# Patient Record
Sex: Female | Born: 2000 | Race: Asian | Hispanic: No | Marital: Single | Smoking: Current every day smoker
Health system: Southern US, Community
[De-identification: ages and names within clinical notes are randomized; demographics above are authoritative.]

## PROBLEM LIST (undated history)

## (undated) DIAGNOSIS — U071 COVID-19: Secondary | ICD-10-CM

## (undated) DIAGNOSIS — F32A Depression, unspecified: Secondary | ICD-10-CM

## (undated) HISTORY — DX: COVID-19: U07.1

## (undated) HISTORY — DX: Depression, unspecified: F32.A

---
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
## 2019-09-21 ENCOUNTER — Encounter (HOSPITAL_COMMUNITY): Payer: Self-pay

## 2019-09-21 ENCOUNTER — Ambulatory Visit (HOSPITAL_COMMUNITY)
Admission: EM | Admit: 2019-09-21 | Discharge: 2019-09-21 | Disposition: A | Discharge status: Home / Self Care | Payer: Medicaid Other | Attending: Physician Assistant | Admitting: Physician Assistant

## 2019-09-21 ENCOUNTER — Ambulatory Visit (INDEPENDENT_AMBULATORY_CARE_PROVIDER_SITE_OTHER): Payer: Medicaid Other

## 2019-09-21 ENCOUNTER — Other Ambulatory Visit: Payer: Self-pay

## 2019-09-21 DIAGNOSIS — S92354A Nondisplaced fracture of fifth metatarsal bone, right foot, initial encounter for closed fracture: Secondary | ICD-10-CM

## 2019-09-21 MED ORDER — IBUPROFEN 800 MG PO TABS: 800.0000 mg | ORAL_TABLET | Freq: Three times a day (TID) | ORAL | 0 refills | Status: DC
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
## 2019-09-21 NOTE — Discharge Instructions (Signed)
Wear the boot in order to protect the areas of pain. Utilize crutches and do not bear weight on your right foot.  Ice the foot for 20-30 minutes at a time for the next day.  Take 800mg  ibuprofen 3 times a day and 2 x 325 mg tylenol every 6 hours for pain.  Do not take the naproxen that you have been prescribed previously, while taking ibuprofen.   Please follow up with the given Orthopedic office. This clinic also has walk in hours in the evening. If you can not schedule an appointment, please go to a walk-in hours. [email or web address] for more information. The number is also provided.
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
## 2021-04-06 ENCOUNTER — Encounter (HOSPITAL_COMMUNITY): Payer: Self-pay

## 2021-04-06 ENCOUNTER — Other Ambulatory Visit: Payer: Self-pay

## 2021-04-06 ENCOUNTER — Ambulatory Visit (HOSPITAL_COMMUNITY)
Admission: EM | Admit: 2021-04-06 | Discharge: 2021-04-06 | Disposition: A | Discharge status: Home / Self Care | Payer: No Typology Code available for payment source | Attending: Family | Admitting: Family

## 2021-04-06 ENCOUNTER — Ambulatory Visit (INDEPENDENT_AMBULATORY_CARE_PROVIDER_SITE_OTHER): Payer: No Typology Code available for payment source

## 2021-04-06 DIAGNOSIS — R079 Chest pain, unspecified: Secondary | ICD-10-CM

## 2021-04-06 DIAGNOSIS — R0789 Other chest pain: Secondary | ICD-10-CM

## 2021-04-06 MED ORDER — DICLOFENAC SODIUM 75 MG PO TBEC: 75.0000 mg | DELAYED_RELEASE_TABLET | Freq: Two times a day (BID) | ORAL | 0 refills | Status: DC | PRN

## 2021-04-06 NOTE — ED Triage Notes (Signed)
Pt presents with sharp intermittent chest pain since yesterday.

## 2021-04-06 NOTE — ED Provider Notes (Signed)
[facility]    CSN: [id number] Arrival date & time: 04/06/21  1709      History   Chief Complaint Chief Complaint  Patient presents with   Chest Pain    HPI Janet Silva is a 20 y.o. female.   20 year old female presents with central chest pain that has been constant since yesterday. Will then get intermittent sharper pains and now has pain along left lower rib cage. Having slight discomfort with deep breathing. Denies any fever, dizziness, change in vision, cough, congestion, nausea, vomiting or diarrhea. No radiation of pain down arms. No numbness. Has not taken any medication yet for pain. Has history of chest wall pain in 2018 and was seen at the ER- thought to be musculoskeletal. No history of smoking or tobacco use, no alcohol or illicit drug use. No chronic health issues. Takes no daily medication.   The history is provided by the patient.   History reviewed. No pertinent past medical history.  There are no problems to display for this patient.   History reviewed. No pertinent surgical history.  OB History   No obstetric history on file.      Home Medications    Prior to Admission medications   Medication Sig Start Date End Date Taking? Authorizing Provider  diclofenac (VOLTAREN) 75 MG EC tablet Take 1 tablet (75 mg total) by mouth every 12 (twelve) hours as needed for moderate pain. 04/06/21  Yes Krishawn Vanderweele, Ali Lowe, NP    Family History History reviewed. No pertinent family history.  Social History Social History   Tobacco Use   Smoking status: Never   Smokeless tobacco: Never  Substance Use Topics   Alcohol use: No   Drug use: No     Allergies   Patient has no known allergies.   Review of Systems Review of Systems  Constitutional:  Negative for activity change, appetite change, chills, diaphoresis, fatigue and fever.  HENT:  Negative for congestion, sore throat and trouble swallowing.   Eyes:  Negative for photophobia and visual  disturbance.  Respiratory:  Negative for cough, chest tightness, shortness of breath and wheezing.   Cardiovascular:  Positive for chest pain. Negative for palpitations.  Gastrointestinal:  Negative for diarrhea, nausea and vomiting.  Musculoskeletal:  Negative for back pain, neck pain and neck stiffness.  Skin:  Negative for color change and rash.  Allergic/Immunologic: Negative for food allergies and immunocompromised state.  Neurological:  Negative for dizziness, tremors, seizures, syncope, facial asymmetry, speech difficulty, weakness, light-headedness, numbness and headaches.  Hematological:  Negative for adenopathy. Does not bruise/bleed easily.    Physical Exam Triage Vital Signs ED Triage Vitals  Enc Vitals Group     BP 04/06/21 1858 113/70     Pulse Rate 04/06/21 1858 88     Resp 04/06/21 1858 18     Temp 04/06/21 1858 98.3 F (36.8 C)     Temp Source 04/06/21 1858 Oral     SpO2 04/06/21 1858 100 %     Weight --      Height --      Head Circumference --      Peak Flow --      Pain Score 04/06/21 1804 5     Pain Loc --      Pain Edu? --      Excl. in [place]? --    No data found.  Updated Vital Signs BP 113/70 (BP Location: Left Arm)   Pulse 88   Temp  98.3 F (36.8 C) (Oral)   Resp 18   LMP 03/21/2021   SpO2 100%   Visual Acuity Right Eye Distance:   Left Eye Distance:   Bilateral Distance:    Right Eye Near:   Left Eye Near:    Bilateral Near:     Physical Exam Vitals and nursing note reviewed.  Constitutional:      General: She is awake. She is not in acute distress.    Appearance: She is well-developed and well-groomed.     Comments: She is sitting on the exam table in no acute distress but appears uncomfortable due to pain.   HENT:     Head: Normocephalic and atraumatic.     Right Ear: Hearing normal.     Left Ear: Hearing normal.  Eyes:     Extraocular Movements: Extraocular movements intact.     Conjunctiva/sclera: Conjunctivae normal.      Pupils: Pupils are equal, round, and reactive to light.  Cardiovascular:     Rate and Rhythm: Normal rate and regular rhythm.     Pulses: Normal pulses.     Heart sounds: Normal heart sounds. No murmur heard. Pulmonary:     Effort: Pulmonary effort is normal. No prolonged expiration or respiratory distress.     Breath sounds: Normal breath sounds and air entry. No decreased air movement. No decreased breath sounds, wheezing, rhonchi or rales.  Chest:     Chest wall: Tenderness present. No mass, deformity or swelling.  Breasts:    Right: No supraclavicular adenopathy.     Left: No supraclavicular adenopathy.       Comments: Tender to palpation over upper sternal area of chest along with along left rib cage, just below breast to axilla area. No distinct swelling or redness. No rash.  Musculoskeletal:        General: Normal range of motion.     Cervical back: Normal range of motion and neck supple. No rigidity or tenderness.  Lymphadenopathy:     Upper Body:     Right upper body: No supraclavicular adenopathy.     Left upper body: No supraclavicular adenopathy.  Skin:    General: Skin is warm and dry.     Capillary Refill: Capillary refill takes less than 2 seconds.     Findings: No rash.  Neurological:     General: No focal deficit present.     Mental Status: She is alert and oriented to person, place, and time.     Sensory: Sensation is intact.     Motor: Motor function is intact.     Gait: Gait is intact.  Psychiatric:        Mood and Affect: Mood normal.        Behavior: Behavior normal. Behavior is cooperative.        Thought Content: Thought content normal.        Judgment: Judgment normal.     UC Treatments / Results  Labs (all labs ordered are listed, but only abnormal results are displayed) Labs Reviewed - No data to display  EKG   Radiology DG Chest 2 View  Result Date: 04/06/2021 CLINICAL DATA:  Left-sided chest pain EXAM: CHEST - 2 VIEW COMPARISON:  None.  FINDINGS: The heart size and mediastinal contours are within normal limits. Both lungs are clear. The visualized skeletal structures are unremarkable. IMPRESSION: No active cardiopulmonary disease. Electronically Signed   By: Jasmine Pang M.D.   On: 04/06/2021 19:30    Procedures Procedures (including  critical care time)  Medications Ordered in UC Medications - No data to display  Initial Impression / Assessment and Plan / UC Course  I have reviewed the triage vital signs and the nursing notes.  Pertinent labs & imaging results that were available during my care of the patient were reviewed by me and considered in my medical decision making (see chart for details).     Reviewed negative chest x-ray results with patient. Since chest wall tender upon palpation, most likely musculoskeletal cause of chest wall pain. Doubt cardiac etiology so did not perform ECG. Doubt pulmonary emboli since risk factors are low (no hormonal therapy and does not smoke). Discussed uncertain cause of pain but continue to monitor. May trial Voltaren 75mg  twice a day as directed for pain. Vital signs and patient stable and in no acute distress. If pain does not improve within 12 hours or if pain gets worse, go to the ER ASAP for further evaluation. Otherwise, follow-up if pain does not resolve within 4 to 5 days.  Final Clinical Impressions(s) / UC Diagnoses   Final diagnoses:  Chest wall pain  Nonspecific chest pain     Discharge Instructions      Recommend start Voltaren 75mg  twice a day as needed for pain. Continue to monitor pain- if it gets worse, go to the ER ASAP. Otherwise, follow-up if pain does not resolve in 4 to 5 days.      ED Prescriptions     Medication Sig Dispense Auth. Provider   diclofenac (VOLTAREN) 75 MG EC tablet Take 1 tablet (75 mg total) by mouth every 12 (twelve) hours as needed for moderate pain. 20 tablet Zaley Talley, , NP      PDMP not reviewed this encounter.   , NP 04/07/21 1428

## 2021-04-06 NOTE — Discharge Instructions (Addendum)
Recommend start Voltaren 75mg  twice a day as needed for pain. Continue to monitor pain- if it gets worse, go to the ER ASAP. Otherwise, follow-up if pain does not resolve in 4 to 5 days.

## 2021-04-10 ENCOUNTER — Other Ambulatory Visit: Payer: Self-pay

## 2021-04-10 ENCOUNTER — Encounter (HOSPITAL_COMMUNITY): Payer: Self-pay | Admitting: *Deleted

## 2021-04-10 ENCOUNTER — Ambulatory Visit (HOSPITAL_COMMUNITY)
Admission: EM | Admit: 2021-04-10 | Discharge: 2021-04-10 | Disposition: A | Discharge status: Home / Self Care | Payer: No Typology Code available for payment source

## 2021-04-10 DIAGNOSIS — M25512 Pain in left shoulder: Secondary | ICD-10-CM | POA: Diagnosis not present

## 2021-04-10 NOTE — ED Triage Notes (Signed)
Pt reports Lt shoulder and arm pain started last night after lifting heavy items at work

## 2021-04-10 NOTE — ED Provider Notes (Signed)
[facility]    CSN: [id number] Arrival date & time: 04/10/21  1504      History   Chief Complaint Chief Complaint  Patient presents with   Shoulder Pain   Arm Pain    HPI Janet Silva is a 20 y.o. female.   Patient here for evaluation of left shoulder pain that started while lifting heavy objects last night.  Reports pain is sharp and worse with movement.  Denies any numbness or tingling to left arm.  Reports having similar pain in her chest several days ago for which she was evaluated and prescribed diclofenac.  Reports taking diclofenac this am which did help with pain temporarily.   Denies any trauma, injury, or other precipitating event.  Denies any specific alleviating or aggravating factors.  Denies any fevers, chest pain, shortness of breath, N/V/D, numbness, tingling, weakness, abdominal pain, or headaches.     The history is provided by the patient.  Shoulder Pain Arm Pain   History reviewed. No pertinent past medical history.  There are no problems to display for this patient.   History reviewed. No pertinent surgical history.  OB History   No obstetric history on file.      Home Medications    Prior to Admission medications   Medication Sig Start Date End Date Taking? Authorizing Provider  diclofenac (VOLTAREN) 75 MG EC tablet Take 1 tablet (75 mg total) by mouth every 12 (twelve) hours as needed for moderate pain. 04/06/21   Sudie Grumbling, NP    Family History History reviewed. No pertinent family history.  Social History Social History   Tobacco Use   Smoking status: Never   Smokeless tobacco: Never  Substance Use Topics   Alcohol use: No   Drug use: No     Allergies   Patient has no known allergies.   Review of Systems Review of Systems  Musculoskeletal:  Positive for arthralgias and joint swelling.  All other systems reviewed and are negative.   Physical Exam Triage Vital Signs ED Triage Vitals [04/10/21 1527]  Enc  Vitals Group     BP 116/67     Pulse Rate 61     Resp 18     Temp 98.7 F (37.1 C)     Temp src      SpO2 100 %     Weight      Height      Head Circumference      Peak Flow      Pain Score      Pain Loc      Pain Edu?      Excl. in [place]?    No data found.  Updated Vital Signs BP 116/67   Pulse 61   Temp 98.7 F (37.1 C)   Resp 18   LMP 03/21/2021   SpO2 100%   Visual Acuity Right Eye Distance:   Left Eye Distance:   Bilateral Distance:    Right Eye Near:   Left Eye Near:    Bilateral Near:     Physical Exam Vitals and nursing note reviewed.  Constitutional:      General: She is not in acute distress.    Appearance: Normal appearance. She is not ill-appearing, toxic-appearing or diaphoretic.  HENT:     Head: Normocephalic and atraumatic.  Eyes:     Conjunctiva/sclera: Conjunctivae normal.  Cardiovascular:     Rate and Rhythm: Normal rate.     Pulses: Normal pulses.  Pulmonary:  Effort: Pulmonary effort is normal.  Abdominal:     General: Abdomen is flat.  Musculoskeletal:        General: Normal range of motion.     Right shoulder: Normal.     Left shoulder: Tenderness present. No deformity, effusion, laceration or bony tenderness. Normal range of motion. Normal strength. Normal pulse.       Arms:     Cervical back: Normal range of motion.  Skin:    General: Skin is warm and dry.  Neurological:     General: No focal deficit present.     Mental Status: She is alert and oriented to person, place, and time.  Psychiatric:        Mood and Affect: Mood normal.     UC Treatments / Results  Labs (all labs ordered are listed, but only abnormal results are displayed) Labs Reviewed - No data to display  EKG   Radiology No results found.  Procedures Procedures (including critical care time)  Medications Ordered in UC Medications - No data to display  Initial Impression / Assessment and Plan / UC Course  I have reviewed the triage vital signs  and the nursing notes.  Pertinent labs & imaging results that were available during my care of the patient were reviewed by me and considered in my medical decision making (see chart for details).    Assessment negative for red flags or concerns.  Acute pain of the left shoulder.  May continue to take diclofenac as previously prescribed.  May also try Tylenol as needed for pain.  Encourage rest and fluids.  Recommend heat, ice, or alternate between heat and ice.  Follow-up with primary care if symptoms do not improve in the next few days. Final Clinical Impressions(s) / UC Diagnoses   Final diagnoses:  Acute pain of left shoulder     Discharge Instructions      Take the Diclofenac as previously prescribed.    You can also try Tylenol as needed for pain and fevers.   Rest and drink plenty of fluids.   You can also try heat, ice, or alternate between heat and ice for comfort.  You can use icy hot patches or lidocaine patches.   Return or go to the Emergency Department if symptoms worsen or do not improve in the next few days.      ED Prescriptions   None    PDMP not reviewed this encounter.   Ivette Loyal, NP 04/10/21 [phone]

## 2021-04-10 NOTE — Discharge Instructions (Signed)
Take the Diclofenac as previously prescribed.    You can also try Tylenol as needed for pain and fevers.   Rest and drink plenty of fluids.   You can also try heat, ice, or alternate between heat and ice for comfort.  You can use icy hot patches or lidocaine patches.   Return or go to the Emergency Department if symptoms worsen or do not improve in the next few days.

## 2021-05-12 ENCOUNTER — Emergency Department (HOSPITAL_COMMUNITY)
Admission: EM | Admit: 2021-05-12 | Discharge: 2021-05-14 | Disposition: A | Discharge status: Home / Self Care | Payer: No Typology Code available for payment source | Attending: Emergency Medicine | Admitting: Emergency Medicine

## 2021-05-12 ENCOUNTER — Other Ambulatory Visit: Payer: Self-pay

## 2021-05-12 ENCOUNTER — Emergency Department (HOSPITAL_COMMUNITY): Payer: No Typology Code available for payment source

## 2021-05-12 ENCOUNTER — Encounter (HOSPITAL_COMMUNITY): Payer: Self-pay

## 2021-05-12 DIAGNOSIS — Y900 Blood alcohol level of less than 20 mg/100 ml: Secondary | ICD-10-CM | POA: Diagnosis not present

## 2021-05-12 DIAGNOSIS — F329 Major depressive disorder, single episode, unspecified: Secondary | ICD-10-CM | POA: Insufficient documentation

## 2021-05-12 DIAGNOSIS — F1410 Cocaine abuse, uncomplicated: Secondary | ICD-10-CM | POA: Diagnosis present

## 2021-05-12 DIAGNOSIS — T39392A Poisoning by other nonsteroidal anti-inflammatory drugs [NSAID], intentional self-harm, initial encounter: Secondary | ICD-10-CM | POA: Diagnosis not present

## 2021-05-12 DIAGNOSIS — U071 COVID-19: Secondary | ICD-10-CM | POA: Diagnosis present

## 2021-05-12 DIAGNOSIS — M79641 Pain in right hand: Secondary | ICD-10-CM | POA: Insufficient documentation

## 2021-05-12 DIAGNOSIS — T50902A Poisoning by unspecified drugs, medicaments and biological substances, intentional self-harm, initial encounter: Secondary | ICD-10-CM

## 2021-05-12 DIAGNOSIS — R45851 Suicidal ideations: Secondary | ICD-10-CM

## 2021-05-12 DIAGNOSIS — F1421 Cocaine dependence, in remission: Secondary | ICD-10-CM | POA: Diagnosis present

## 2021-05-12 LAB — CBC
HCT: 37.3 % (ref 36.0–46.0)
Hemoglobin: 11.4 g/dL — ABNORMAL LOW (ref 12.0–15.0)
MCH: 22.6 pg — ABNORMAL LOW (ref 26.0–34.0)
MCHC: 30.6 g/dL (ref 30.0–36.0)
MCV: 73.9 fL — ABNORMAL LOW (ref 80.0–100.0)
Platelets: 273 10*3/uL (ref 150–400)
RBC: 5.05 MIL/uL (ref 3.87–5.11)
RDW: 19.1 % — ABNORMAL HIGH (ref 11.5–15.5)
WBC: 8 10*3/uL (ref 4.0–10.5)
nRBC: 0 % (ref 0.0–0.2)

## 2021-05-12 LAB — COMPREHENSIVE METABOLIC PANEL
ALT: 12 U/L (ref 0–44)
AST: 19 U/L (ref 15–41)
Albumin: 4.2 g/dL (ref 3.5–5.0)
Alkaline Phosphatase: 69 U/L (ref 38–126)
Anion gap: 9 (ref 5–15)
BUN: 15 mg/dL (ref 6–20)
CO2: 25 mmol/L (ref 22–32)
Calcium: 9.4 mg/dL (ref 8.9–10.3)
Chloride: 104 mmol/L (ref 98–111)
Creatinine, Ser: 0.82 mg/dL (ref 0.44–1.00)
GFR, Estimated: >60 mL/min (ref >60)
Glucose, Bld: 72 mg/dL (ref 70–99)
Potassium: 3.5 mmol/L (ref 3.5–5.1)
Sodium: 138 mmol/L (ref 135–145)
Total Bilirubin: 0.8 mg/dL (ref 0.3–1.2)
Total Protein: 8.2 g/dL — ABNORMAL HIGH (ref 6.5–8.1)

## 2021-05-12 LAB — RESP PANEL BY RT-PCR (FLU A&B, COVID) ARPGX2
Influenza A by PCR: NEGATIVE
Influenza B by PCR: NEGATIVE
SARS Coronavirus 2 by RT PCR: POSITIVE — AB

## 2021-05-12 LAB — RAPID URINE DRUG SCREEN, HOSP PERFORMED
Amphetamines: NOT DETECTED
Barbiturates: NOT DETECTED
Benzodiazepines: NOT DETECTED
Cocaine: POSITIVE — AB
Opiates: NOT DETECTED
Tetrahydrocannabinol: NOT DETECTED

## 2021-05-12 LAB — I-STAT BETA HCG BLOOD, ED (MC, WL, AP ONLY): I-stat hCG, quantitative: <5 m[IU]/mL (ref <5)

## 2021-05-12 LAB — ETHANOL: Alcohol, Ethyl (B): <10 mg/dL (ref <10)

## 2021-05-12 LAB — MAGNESIUM: Magnesium: 2.2 mg/dL (ref 1.7–2.4)

## 2021-05-12 LAB — SALICYLATE LEVEL: Salicylate Lvl: <7 mg/dL — ABNORMAL LOW (ref 7.0–30.0)

## 2021-05-12 LAB — ACETAMINOPHEN LEVEL: Acetaminophen (Tylenol), Serum: <10 ug/mL — ABNORMAL LOW (ref 10–30)

## 2021-05-12 MED ORDER — SODIUM CHLORIDE 0.9 % IV BOLUS
1000.0000 mL | Freq: Once | INTRAVENOUS | Status: AC
  Administered 2021-05-12: 1000 mL via INTRAVENOUS

## 2021-05-12 NOTE — ED Notes (Addendum)
Called Poison Control. Recommendations: Can cause nausea, vomiting, drowsiness, renal insufficiency, metabolic acidosis, cardiac monitor, check EKG, 4 hour tylenol level, check CMP, Magnesium, alcohol, antiemetics as needed. Repeat BMP after 4 hours to make sure kidney function is good and not acidotic.

## 2021-05-12 NOTE — ED Notes (Signed)
Patient reports doing cocaine this morning and had 2 beers at 1pm.

## 2021-05-12 NOTE — ED Provider Notes (Signed)
[facility]-EMERGENCY DEPT Provider Note   CSN: [id number] Arrival date & time: 05/12/21  2158     History Chief Complaint  Patient presents with   Suicide Attempt    Janet Silva is a 20 y.o. female without significant past medical hx who presents to the ED S/p suicide attempt @ 20:30-2100 tonight. Patient states she took 6-7 tablets of diclofenac, unknown dose, no co-ingestion, and she punched a wall with her right hand. Having some mild nausea and some right hand pain. No alleviating/aggravating factors. Has been feeling depressed leading to suicide attempt. Denies HI or hallucinations. Had cocaine this AM and a couple of beers around noon today.   HPI     History reviewed. No pertinent past medical history.  There are no problems to display for this patient.   History reviewed. No pertinent surgical history.   OB History   No obstetric history on file.     History reviewed. No pertinent family history.  Social History   Tobacco Use   Smoking status: Never   Smokeless tobacco: Never  Substance Use Topics   Alcohol use: Yes    Comment: "every day i drink all of them"   Drug use: Yes    Frequency: 2.0 times per week    Types: Cocaine    Home Medications Prior to Admission medications   Medication Sig Start Date End Date Taking? Authorizing Provider  diclofenac (VOLTAREN) 75 MG EC tablet Take 1 tablet (75 mg total) by mouth every 12 (twelve) hours as needed for moderate pain. 04/06/21  Yes Amyot, Ali Lowe, NP    Allergies    Patient has no known allergies.  Review of Systems   Review of Systems  Constitutional:  Negative for chills and fever.  Respiratory:  Negative for cough and shortness of breath.   Cardiovascular:  Negative for chest pain.  Gastrointestinal:  Positive for nausea. Negative for abdominal pain, blood in stool, constipation, diarrhea and vomiting.  Genitourinary:  Negative for dysuria.  Musculoskeletal:  Positive for  arthralgias.  Neurological:  Negative for syncope.  All other systems reviewed and are negative.  Physical Exam Updated Vital Signs BP 111/70   Pulse (!) 54   Temp 98.1 F (36.7 C) (Oral)   Resp 18   Ht 5\' 7"  (1.702 m)   Wt 46.3 kg   SpO2 99%   BMI 15.98 kg/m   Physical Exam Vitals and nursing note reviewed.  Constitutional:      General: She is not in acute distress.    Appearance: Normal appearance. She is well-developed. She is not ill-appearing or toxic-appearing.  HENT:     Head: Normocephalic and atraumatic.  Eyes:     General:        Right eye: No discharge.        Left eye: No discharge.     Conjunctiva/sclera: Conjunctivae normal.  Neck:     Comments: No midline tenderness.  Cardiovascular:     Rate and Rhythm: Normal rate and regular rhythm.     Pulses:          Radial pulses are 2+ on the right side and 2+ on the left side.  Pulmonary:     Effort: Pulmonary effort is normal. No respiratory distress.     Breath sounds: Normal breath sounds. No wheezing, rhonchi or rales.  Abdominal:     General: There is no distension.     Palpations: Abdomen is soft.     Tenderness:  There is no abdominal tenderness.  Musculoskeletal:     Cervical back: Normal range of motion and neck supple.     Comments: Upper extremities: Mild swelling to the right 5th MCP. No  erythema or open wounds. Patient has intact AROM throughout. . Tender to palpation over the right 4th/5th MCPs and proximal phalanges otherwise nontender. No anatomical snuffbox tenderness.   Skin:    General: Skin is warm and dry.     Capillary Refill: Capillary refill takes less than 2 seconds.     Findings: No rash.  Neurological:     Mental Status: She is alert.     Comments: Alert. Clear speech. Sensation grossly intact to bilateral upper extremities. 5/5 symmetric grip strength. Ambulatory.   Psychiatric:        Behavior: Behavior is cooperative.        Thought Content: Thought content includes suicidal  ideation. Thought content does not include homicidal ideation.    ED Results / Procedures / Treatments   Labs (all labs ordered are listed, but only abnormal results are displayed) Labs Reviewed  RESP PANEL BY RT-PCR (FLU A&B, COVID) ARPGX2 - Abnormal; Notable for the following components:      Result Value   SARS Coronavirus 2 by RT PCR POSITIVE (*)    All other components within normal limits  CBC - Abnormal; Notable for the following components:   Hemoglobin 11.4 (*)    MCV 73.9 (*)    MCH 22.6 (*)    RDW 19.1 (*)    All other components within normal limits  COMPREHENSIVE METABOLIC PANEL - Abnormal; Notable for the following components:   Total Protein 8.2 (*)    All other components within normal limits  SALICYLATE LEVEL - Abnormal; Notable for the following components:   Salicylate Lvl <7.0 (*)    All other components within normal limits  ACETAMINOPHEN LEVEL - Abnormal; Notable for the following components:   Acetaminophen (Tylenol), Serum <10 (*)    All other components within normal limits  RAPID URINE DRUG SCREEN, HOSP PERFORMED - Abnormal; Notable for the following components:   Cocaine POSITIVE (*)    All other components within normal limits  BASIC METABOLIC PANEL - Abnormal; Notable for the following components:   Potassium 3.4 (*)    Glucose, Bld 60 (*)    Calcium 8.6 (*)    All other components within normal limits  ACETAMINOPHEN LEVEL - Abnormal; Notable for the following components:   Acetaminophen (Tylenol), Serum <10 (*)    All other components within normal limits  ETHANOL  MAGNESIUM  I-STAT BETA HCG BLOOD, ED ([facility], [facility], [facility] ONLY)  CBG MONITORING, ED    EKG EKG Interpretation  Date/Time:  Thursday May 12 2021 22:48:43 EDT Ventricular Rate:  86 PR Interval:  156 QRS Duration: 90 QT Interval:  379 QTC Calculation: 454 R Axis:   83 Text Interpretation: Sinus rhythm Probable left ventricular hypertrophy Confirmed by Kristine Royal [phone]) on  05/12/2021 11:01:27 PM  Radiology DG Hand Complete Right  Result Date: 05/12/2021 CLINICAL DATA:  Hand pain common no known injury, initial encounter EXAM: RIGHT HAND - COMPLETE 3+ VIEW COMPARISON:  None. FINDINGS: There is no evidence of fracture or dislocation. There is no evidence of arthropathy or other focal bone abnormality. Soft tissues are unremarkable. IMPRESSION: No acute abnormality noted. Electronically Signed   By: Alcide Clever M.D.   On: 05/12/2021 22:54    Procedures Procedures   Medications Ordered in ED Medications  sodium chloride 0.9 % bolus 1,000 mL (0 mLs Intravenous Stopped 05/13/21 0053)    ED Course  I have reviewed the triage vital signs and the nursing notes.  Pertinent labs & imaging results that were available during my care of the patient were reviewed by me and considered in my medical decision making (see chart for details).  Janet Silva was evaluated in Emergency Department on 05/13/2021 for the symptoms described in the history of present illness. He/she was evaluated in the context of the global COVID-19 pandemic, which necessitated consideration that the patient might be at risk for infection with the SARS-CoV-2 virus that causes COVID-19. Institutional protocols and algorithms that pertain to the evaluation of patients at risk for COVID-19 are in a state of rapid change based on information released by regulatory bodies including the CDC and federal and state organizations. These policies and algorithms were followed during the patient's care in the ED.    MDM Rules/Calculators/A&P                           Patient presents to the ED S/p intentional overdose with diclofenac.  Nontoxic, vitals without significant abnormality.  Case discussed with poison control by nursing staff: [facility]. Recommendations: Can cause nausea, vomiting, drowsiness, renal insufficiency, metabolic acidosis, cardiac monitor, check EKG, 4 hour tylenol level, check  CMP, Magnesium, alcohol, antiemetics as needed. Repeat BMP after 4 hours to make sure kidney function is good and not acidotic.   R hand injury as well- mild swelling with tenderness to palpation to 4th/5th MCP/proximal phalanges- xrays ordered. NVI distally.   Additional history obtained:  Additional history obtained from chart review & nursing note review.   EKG:  Sinus rhythm Probable left ventricular hypertrophy   Lab Tests:  I Ordered, reviewed, and interpreted labs, which included:  CBC: mild anemia.  CMP: Fairly unremarkable.  Ethanol/tylenol/salicylate levels: Negative Mg: WNL Preg test: Negative UDS: Positive for cocaine.  COVID: Positive.   Imaging Studies ordered:  I ordered imaging studies which included Right hand x-ray, I independently reviewed, formal radiology impression shows: No acute abnormality noted.   ED Course:  Repeat BMP/tylenol level @ 4 hours- tylenol level remains negative, BMP without acidosis or renal dysfunction. Mild hypoglycemia on BMP- given soda with improvement into the 80s, also given a sandwich after this.   Case re-discsused with poison control- patient medically cleared. Consult placed to TTS. Disposition per [facility].   The patient has been placed in psychiatric observation due to the need to provide a safe environment for the patient while obtaining psychiatric consultation and evaluation, as well as ongoing medical and medication management to treat the patient's condition.  The patient has not been placed under full IVC at this time.  Portions of this note were generated with Scientist, clinical (histocompatibility and immunogenetics). Dictation errors may occur despite best attempts at proofreading.  Final Clinical Impression(s) / ED Diagnoses Final diagnoses:  Intentional drug overdose, initial encounter [facility])  Suicidal ideation  COVID-19    Rx / DC Orders ED Discharge Orders     None        Jakerra, Floyd, PA-C 05/13/21 [id number]    Gilda Crease,  MD 05/13/21 [phone]

## 2021-05-12 NOTE — ED Triage Notes (Addendum)
Patient BIB [facility] voluntarily after suicide attempt of taking 7 75mg  Diclofenac at 9pm tonight. Patient has history of depression. Has had suicidal thoughts before.

## 2021-05-12 NOTE — ED Notes (Signed)
Pt changed into burgundy scrubs. All personal belongings placed in 2 bags in Cabinet: 16-18, Res A. Security notified to wand pt.

## 2021-05-13 DIAGNOSIS — U071 COVID-19: Secondary | ICD-10-CM

## 2021-05-13 DIAGNOSIS — T39392A Poisoning by other nonsteroidal anti-inflammatory drugs [NSAID], intentional self-harm, initial encounter: Secondary | ICD-10-CM | POA: Diagnosis not present

## 2021-05-13 LAB — BASIC METABOLIC PANEL
Anion gap: 9 (ref 5–15)
BUN: 16 mg/dL (ref 6–20)
CO2: 22 mmol/L (ref 22–32)
Calcium: 8.6 mg/dL — ABNORMAL LOW (ref 8.9–10.3)
Chloride: 108 mmol/L (ref 98–111)
Creatinine, Ser: 0.91 mg/dL (ref 0.44–1.00)
GFR, Estimated: >60 mL/min (ref >60)
Glucose, Bld: 60 mg/dL — ABNORMAL LOW (ref 70–99)
Potassium: 3.4 mmol/L — ABNORMAL LOW (ref 3.5–5.1)
Sodium: 139 mmol/L (ref 135–145)

## 2021-05-13 LAB — CBG MONITORING, ED: Glucose-Capillary: 88 mg/dL (ref 70–99)

## 2021-05-13 LAB — ACETAMINOPHEN LEVEL: Acetaminophen (Tylenol), Serum: <10 ug/mL — ABNORMAL LOW (ref 10–30)

## 2021-05-13 MED ORDER — ACETAMINOPHEN 325 MG PO TABS
650.0000 mg | ORAL_TABLET | ORAL | Status: DC | PRN
Start: 2021-05-13 — End: 2021-05-14

## 2021-05-13 MED ORDER — ONDANSETRON HCL 4 MG PO TABS: 4.0000 mg | ORAL_TABLET | Freq: Three times a day (TID) | ORAL | Status: DC | PRN

## 2021-05-13 MED ORDER — ALUM & MAG HYDROXIDE-SIMETH 200-200-20 MG/5ML PO SUSP: 30.0000 mL | Freq: Four times a day (QID) | ORAL | Status: DC | PRN

## 2021-05-13 MED ORDER — ZOLPIDEM TARTRATE 5 MG PO TABS
5.0000 mg | ORAL_TABLET | Freq: Every evening | ORAL | Status: DC | PRN
Start: 2021-05-13 — End: 2021-05-14

## 2021-05-13 NOTE — BH Assessment (Signed)
Comprehensive Clinical Assessment (CCA) Note  05/13/2021 Janet Silva [id number]  Disposition: Per Melbourne Abts, PA inpatient treatment is recommended   Disposition SW to pursue appropriate inpatient options. Patient is Covid +.    The patient demonstrates the following risk factors for suicide: Chronic risk factors for suicide include: psychiatric disorder of untreated depression and substance use disorder. Acute risk factors for suicide include: family or marital conflict, unemployment, and loss (financial, interpersonal, professional). Protective factors for this patient include: positive social support and responsibility to others (children, family). Considering these factors, the overall suicide risk at this point appears to be high. Patient is appropriate for outpatient follow up once stabilized.   Patient is a 20 year old female with a history of untreated depression, Alcohol Use Disorder, severe and Cocaine Use Disorder, moderate who presents voluntarily to [facility] via GPD status post intentional overdose.  Patient admits to taking 7(75mg ) Diclofenac following an argument with her mother.  Patient states she and her mother have been having more arguments recently and they have become "so bad."  She states her mother tried to kick her out of the house last night, at which point patient responded by overdosing.  She states she was feeling hopeless and suicidal due to the argument and other stressors that have been mounting.  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  She also reports having relationship problems recently with her girlfriend.  Patient continues to endorse SI, as she continues to feel hopeless.  She denies HI and AVH.  She has been drinking daily for the past couple of years.  She reports she has been drinking more recently; currently drinking 2 fifths per day.  She uses cocaine twice per week.  Patient has limited  insight into her current substance use concerns, and she has not engaged in [place] treatment.  Patient has no past suicide attempts and no past inpatient admissions.  She did engage in therapy once, around age 35 when her father died of cancer.  Also, she has been on an unknown antidepressant years ago, prescribed by her PCP.   Treatment options were discussed, and patient is agreeable with the recommendation for inpatient treatment  Patient is Covid +, limiting inpatient treatment options at this time.   Chief Complaint:  Chief Complaint  Patient presents with   Suicide Attempt   Visit Diagnosis: Major Depressive Disorder, recurrent, severe - untreated                              Alcohol Use Disorder, severe                             Cocaine Use Disorder, moderate  Flowsheet Row ED from 05/12/2021 in  [facility]  Thoughts that you would be better off dead, or of hurting yourself in some way Several days  PHQ-9 Total Score 15      Flowsheet Row ED from 05/12/2021 in [facility]  HOSPITAL-EMERGENCY DEPT ED from 04/10/2021 in [facility] ED from 04/06/2021 in [facility] RISK CATEGORY High Risk No Risk No Risk       CCA Screening, Triage and Referral (STR)  Patient Reported Information How did you hear about [place]? Legal System  What Is the Reason for Your  Visit/Call Today? Patient presents via [employer] after an intentional overdose.  Patient reports she got into "another" argument with her mother. She states her mother tried to kick her out just before she overdosed on 7 -75mg  Diclofenac.  She reports multiple stressors, and this was "just too much." She also reports she has been drinking daily and using cocaine.  How Long Has This Been Causing You Problems? 1-6 months  What Do You Feel Would Help You the Most Today? Alcohol or Drug Use Treatment   Have You Recently Had Any Thoughts About  Hurting Yourself? Yes  Are You Planning to Commit Suicide/Harm Yourself At This time? Yes   Have you Recently Had Thoughts About Hurting Someone Karolee Ohslse? No  Are You Planning to Harm Someone at This Time? No  Explanation: No data recorded  Have You Used Any Alcohol or Drugs in the Past 24 Hours? Yes  How Long Ago Did You Use Drugs or Alcohol? No data recorded What Did You Use and How Much? 2 beers around 1pm and last cocaine use was earlier this morning (amt unknown).   Do You Currently Have a Therapist/Psychiatrist? No  Name of Therapist/Psychiatrist: No data recorded  Have You Been Recently Discharged From Any Office Practice or Programs? No  Explanation of Discharge From Practice/Program: No data recorded    CCA Screening Triage Referral Assessment Type of Contact: Tele-Assessment  Telemedicine Service Delivery: Telemedicine service delivery: This service was provided via telemedicine using a 2-way, interactive audio and video technology  Is this Initial or Reassessment? Initial Assessment  Date Telepsych consult ordered in [facility]:  05/13/21  [facility]  Location of Assessment: [facility]  [facility]   Collateral Involvement: N/A   Does Patient Have a Court Appointed Legal Guardian? No data recorded Name and Contact of Legal Guardian: No data recorded If Minor and Not Living with Parent(s), Who has Custody? No data recorded Is CPS involved or ever been involved? Never  Is APS involved or ever been involved? Never   Patient Determined To Be At Risk for Harm To Self or Others Based on Review of Patient Reported Information or Presenting Complaint? Yes, for Self-Harm  Method: No data recorded Availability of Means: No data recorded Intent: No data recorded Notification Required: No data recorded Additional Information for Danger to Others Potential: No data recorded Additional Comments for Danger to  Others Potential: No data recorded Are There Guns or Other Weapons in Your Home? No data recorded Types of Guns/Weapons: No data recorded Are These Weapons Safely Secured?                            No data recorded Who Could Verify You Are Able To Have These Secured: No data recorded Do You Have any Outstanding Charges, Pending Court Dates, Parole/Probation? No data recorded Contacted To Inform of Risk of Harm To Self or Others: Other: Comment (ED staff aware of safety concerns.  Inpt recommended.)    Does Patient Present under Involuntary Commitment? No  IVC Papers Initial File Date: No data recorded  [place]   Patient Currently Receiving the Following Services: Not Receiving Services   Determination of Need: Emergent (2 hours)   Options For Referral: Inpatient Hospitalization     CCA Biopsychosocial Patient Reported Schizophrenia/Schizoaffective Diagnosis in Past: No   Strengths: Open to treatment, has support   Mental Health Symptoms Depression:  Change in energy/activity; Hopelessness; Weight gain/loss; Increase/decrease in appetite; Worthlessness   Duration of Depressive symptoms:  Duration of Depressive Symptoms: Greater than two weeks   Mania:   None   Anxiety:    Worrying; Tension   Psychosis:   None   Duration of Psychotic symptoms:    Trauma:   None (none reported)   Obsessions:   None   Compulsions:   None   Inattention:   N/A   Hyperactivity/Impulsivity:   N/A   Oppositional/Defiant Behaviors:   N/A   Emotional Irregularity:   Chronic feelings of emptiness; Intense/unstable relationships; Potentially harmful impulsivity   Other Mood/Personality Symptoms:  No data recorded   Mental Status Exam Appearance and self-care  Stature:   Average   Weight:   Underweight   Clothing:   Casual   Grooming:   Normal   Cosmetic use:   None   Posture/gait:   Normal   Motor activity:   Not Remarkable    Sensorium  Attention:   Normal   Concentration:   Normal   Orientation:   X5   Recall/memory:  No data recorded  Affect and Mood  Affect:   Appropriate; Depressed   Mood:   Depressed   Relating  Eye contact:   Normal   Facial expression:   Depressed; Responsive   Attitude toward examiner:   Cooperative   Thought and Language  Speech flow:  Clear and Coherent   Thought content:   Appropriate to Mood and Circumstances   Preoccupation:   None   Hallucinations:   None   Organization:  No data recorded  [employer] of Knowledge:   Average   Intelligence:   Average   Abstraction:   Normal   Judgement:   Impaired   Reality Testing:   Adequate   Insight:   Lacking   Decision Making:   Impulsive; Vacilates   Social Functioning  Social Maturity:   Impulsive   Social Judgement:   Heedless   Stress  Stressors:   Family conflict; Relationship; Work   Coping Ability:   Exhausted; Overwhelmed   Skill Deficits:   Communication; Decision making; Self-control; Interpersonal   Supports:   Family; Friends/Service system     Religion: Religion/Spirituality Are You A Religious Person?: No  Leisure/Recreation: Leisure / Recreation Do You Have Hobbies?: No  Exercise/Diet: Exercise/Diet Do You Exercise?: No Have You Gained or Lost A Significant Amount of Weight in the Past Six Months?: Yes-Lost Number of Pounds Lost?: 15 (gradual loss over 5-6 mos) Do You Follow a Special Diet?: No Do You Have Any Trouble Sleeping?: Yes Explanation of Sleeping Difficulties: varies, sleeps well some nights and minimal sleep others   CCA Employment/Education Employment/Work Situation: Employment / Work Situation Employment Situation: Unemployed Patient's Job has Been Impacted by Current Illness: Yes Describe how Patient's Job has Been Impacted: 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  days Has Patient ever Been in Equities trader?: No  Education: Education Is Patient Currently Attending School?: No Last Grade Completed: 12 Did You Product manager?: No Did You Have An Individualized Education Program (IIEP): No Did You Have Any Difficulty At [employer]?: No Patient's Education Has Been Impacted by Current Illness: No   CCA Family/Childhood History Family and Relationship History: Family history Marital status: Single Does patient have children?: No  Childhood History:  Childhood History By whom was/is the patient raised?: Mother, Father, Psychologist, occupational and  step-parent Did patient suffer any verbal/emotional/physical/sexual abuse as a child?: No Did patient suffer from severe childhood neglect?: No Has patient ever been sexually abused/assaulted/raped as an adolescent or adult?: No Was the patient ever a victim of a crime or a disaster?: No Witnessed domestic violence?: No Has patient been affected by domestic violence as an adult?: No  Child/Adolescent Assessment:     CCA Substance Use Alcohol/Drug Use: Alcohol / Drug Use Pain Medications: See MAR Prescriptions: See MAR Over the Counter: See MAR History of alcohol / drug use?: Yes Longest period of sobriety (when/how long): NA, has been drinking daily since she started sev. yrs ago (use has increased) Negative Consequences of Use: Financial, Personal relationships Withdrawal Symptoms: None Substance #1 Name of Substance 1: ETOH 1 - Age of First Use: 17 1 - Amount (size/oz): 2 fifths daily 1 - Frequency: "years" 1 - Duration: 2-3 years - doesn't recall a period of sobriety during this time 1 - Last Use / Amount: 2 beers around 1pm 1 - Method of Aquiring: NA 1- Route of Use: drinking Substance #2 Name of Substance 2: Cocaine 2 - Age of First Use: 17 2 - Amount (size/oz): varies 2 - Frequency: 2 times per week 2 - Duration: approx 2 yrs 2 - Last Use / Amount: yesterday morning-amt unknown 2 - Method  of Aquiring: NA 2 - Route of Substance Use: smokes   ASAM's:  Six Dimensions of Multidimensional Assessment  Dimension 1:  Acute Intoxication and/or Withdrawal Potential:   Dimension 1:  Description of individual's past and current experiences of substance use and withdrawal: No current w/d sx and no hx of significant withdrawals.  She doesn't recall a period of sobriety during the last 1-2 yrs she has been drinking.  Dimension 2:  Biomedical Conditions and Complications:   Dimension 2:  Description of patient's biomedical conditions and  complications: No medical concerns and able to cope with discomfort/pain.  Dimension 3:  Emotional, Behavioral, or Cognitive Conditions and Complications:  Dimension 3:  Description of emotional, behavioral, or cognitive conditions and complications: Worsening depression related to mult. stressors lead to an attempt last night.  Dimension 4:  Readiness to Change:  Dimension 4:  Description of Readiness to Change criteria: Open to inpatient treatment recommendation.  Dimension 5:  Relapse, Continued use, or Continued Problem Potential:  Dimension 5:  Relapse, continued use, or continued problem potential critiera description: Limited insight into current use pattern being dependent with daily use.  Dimension 6:  Recovery/Living Environment:  Dimension 6:  Recovery/Iiving environment criteria description: some support from girlfriend  ASAM Severity Score: ASAM's Severity Rating Score: 8  ASAM Recommended Level of Treatment: ASAM Recommended Level of Treatment: Level III Residential Treatment   Substance use Disorder (SUD) Substance Use Disorder (SUD)  Checklist Symptoms of Substance Use: Continued use despite persistent or recurrent social, interpersonal problems, caused or exacerbated by use, Evidence of tolerance, Persistent desire or unsuccessful efforts to cut down or control use, Presence of craving or strong urge to use, Recurrent use that results in a  failure to fulfill major role obligations (work, school, home)  Recommendations for Services/Supports/Treatments: Recommendations for Services/Supports/Treatments Recommendations For Services/Supports/Treatments: Inpatient Hospitalization, CD-IOP Intensive Chemical Dependency Program, Detox  Discharge Disposition:   DSM5 Diagnoses: There are no problems to display for this patient.  Referrals to Alternative Service(s): Yetta Glassman, [facility]

## 2021-05-13 NOTE — ED Notes (Signed)
Sitter at bedside.

## 2021-05-13 NOTE — BH Assessment (Addendum)
Disposition Note:  Per Melbourne Abts, PA inpatient treatment is recommended.  Disposition SW to pursue appropriate inpatient options. Patient is Covid +, therefore; unable to be considered at [facility] for admission.  Patient unlikely to be placed at another facility due to COVID status. However, Disposition Counselor has faxed patient out to facilities for consideration of bed placement as listed below. Situation on-going. Psychiatry/TTS to continued daily assessments as patient remains in the ED pending inpatient psychiatric treatment.   Hospital placement (see below): Destination Service Provider Request Status Address Phone [facility]  Pending - Request Sent [address] [place] [address] [phone]  [facility]  Pending - Request Sent [address] [place] [address] [phone]  [facility]  Pending - Request [facility] [address] [place] [address] [phone]  [facility]  Pending - Request Sent [id number] [place] [address] [phone]  [facility]  Pending - Request Sent [address] [place], [place] [address] [phone]  [facility]  Pending - Request Sent [address], [place] [address] [phone]  [facility]  Pending - Request Sent [address], [place] [address] [phone]  [facility]  Pending - Request Sent [address], [place] [address] [phone]  [facility]  Pending - Request Sent [address] [place] [address] [phone]  [facility]  Pending - Request Sent [address] [place] [address] [phone]  [facility]  Pending - Request Sent [address], [place]  [address] [phone]  [facility]  Pending - Request Sent [address], [place] [address] [phone]  [facility]  Pending - Request Sent [address] [place] [address] [phone]  [facility]  Pending - Request Sent [address], [place] [address] [phone]  [facility]  Pending - Request Sent [address] [place], [place] [address] [phone]  [facility]  Pending - Request Sent [address] [place] [address] [phone]

## 2021-05-13 NOTE — ED Notes (Signed)
Pt to room 29. Pt alert, calm, cooperative, no s/s of distress. Pt oriented to unit and room. Pt resting in bed comfortably. Sitter at bedside

## 2021-05-13 NOTE — Consult Note (Signed)
[facility]    Date of Admission:  05/12/2021     Reason for Consult: covid related isolation issue    Referring Provider: behavior health      Assessment: Positive covid pcr; assymptomatic Polysubstance abuse  SI Depression  Patient assymptomatic, with only 1 covid pcr test recently on 8/25 While she remains in the inpatient/LTAC setting, would follow facility covid isolation guideline of 10 days for mild disease (from the time of positive test or from the first day if symptoms develops)  If so desired, can repeat covid pcr test (one with cycle threshold capability) at day 7 from the original test and see by cycle threshold if she can be removed from isolation earlier    Plan: Keep in contact/airborne precaution for covid Per facility 10 day isolation recommended from 8/25. Otherwise refer to above for more detail Supportive care per primary team      ------------------------------------------------ Active Problems:   * No active hospital problems. *    HPI: Janet Silva is a 20 y.o. female self admit for SI/intentional overdose  She tested positive for covid on 8/25 presentation, however no fever/chill, sepsis. And she denies having flu like illness otherwise  Primary team is asking about isolation evaluation    History reviewed. No pertinent family history.  Social History   Tobacco Use   Smoking status: Never   Smokeless tobacco: Never  Substance Use Topics   Alcohol use: Yes    Comment: "every day i drink all of them"   Drug use: Yes    Frequency: 2.0 times per week    Types: Cocaine    No Known Allergies  Review of Systems: ROS All Other ROS was negative, except mentioned above   History reviewed. No pertinent past medical history.     Scheduled Meds: Continuous Infusions: PRN Meds:.acetaminophen, alum & mag hydroxide-simeth, ondansetron, zolpidem   OBJECTIVE: Blood pressure 114/73, pulse 64,  temperature 98.1 F (36.7 C), temperature source Oral, resp. rate 18, height 5\' 7"  (1.702 m), weight 46.3 kg, SpO2 100 %.  Physical Exam  General/constitutional: no distress, pleasant HEENT: Normocephalic, PER, Conj Clear, EOMI, Oropharynx clear Neck supple CV: rrr no mrg Lungs: clear to auscultation, normal respiratory effort Abd: Soft, Nontender Ext: no edema Skin: No Rash Neuro: nonfocal MSK: no peripheral joint swelling/tenderness/warmth; back spines nontender Psych: alert/oriented  Lab Results Lab Results  Component Value Date   WBC 8.0 05/12/2021   HGB 11.4 (L) 05/12/2021   HCT 37.3 05/12/2021   MCV 73.9 (L) 05/12/2021   PLT 273 05/12/2021    Lab Results  Component Value Date   CREATININE 0.91 05/13/2021   BUN 16 05/13/2021   NA 139 05/13/2021   K 3.4 (L) 05/13/2021   CL 108 05/13/2021   CO2 22 05/13/2021    Lab Results  Component Value Date   ALT 12 05/12/2021   AST 19 05/12/2021   ALKPHOS 69 05/12/2021   BILITOT 0.8 05/12/2021      Microbiology: Recent Results (from the past 240 hour(s))  Resp Panel by RT-PCR (Flu A&B, Covid) Nasopharyngeal Swab     Status: Abnormal   Collection Time: 05/12/21 10:26 PM   Specimen: Nasopharyngeal Swab; Nasopharyngeal(NP) swabs in vial transport medium  Result Value Ref Range Status   SARS Coronavirus 2 by RT PCR POSITIVE (A) NEGATIVE Final    Comment: CRITICAL RESULT CALLED TO, READ BACK BY AND VERIFIED WITH: 05/14/21 RN.@2328  ON  8.25.22 BY TCALDWELL MT. (NOTE) SARS-CoV-2 target nucleic acids are DETECTED.  The SARS-CoV-2 RNA is generally detectable in upper respiratory specimens during the acute phase of infection. Positive results are indicative of the presence of the identified virus, but do not rule out bacterial infection or co-infection with other pathogens not detected by the test. Clinical correlation with patient history and other diagnostic information is necessary to determine patient infection  status. The expected result is Negative.  Fact Sheet for Patients: BloggerCourse.com  Fact Sheet for Healthcare Providers: SeriousBroker.it  This test is not yet approved or cleared by the [place] FDA and  has been authorized for detection and/or diagnosis of SARS-CoV-2 by FDA under an Emergency Use Authorization (EUA).  This EUA will remain in effec t (meaning this test can be used) for the duration of  the COVID-19 declaration under Section 564(b)(1) of the Act, 21 U.S.C. section 360bbb-3(b)(1), unless the authorization is terminated or revoked sooner.     Influenza A by PCR NEGATIVE NEGATIVE Final   Influenza B by PCR NEGATIVE NEGATIVE Final    Comment: (NOTE) The Xpert Xpress SARS-CoV-2/FLU/RSV plus assay is intended as an aid in the diagnosis of influenza from Nasopharyngeal swab specimens and should not be used as a sole basis for treatment. Nasal washings and aspirates are unacceptable for Xpert Xpress SARS-CoV-2/FLU/RSV testing.  Fact Sheet for Patients: BloggerCourse.com  Fact Sheet for Healthcare Providers: SeriousBroker.it  This test is not yet approved or cleared by the [place] FDA and has been authorized for detection and/or diagnosis of SARS-CoV-2 by FDA under an Emergency Use Authorization (EUA). This EUA will remain in effect (meaning this test can be used) for the duration of the COVID-19 declaration under Section 564(b)(1) of the Act, 21 U.S.C. section 360bbb-3(b)(1), unless the authorization is terminated or revoked.  Performed at [facility], [address], [place], [place] [address]      Serology:    Imaging: If present, new imagings (plain films, ct scans, and mri) have been personally visualized and interpreted; radiology reports have been reviewed. Decision making incorporated into the Impression /  Recommendations.    Raymondo Band, MD [facility] [phone] pager    05/13/2021, 3:28 PM

## 2021-05-13 NOTE — ED Notes (Signed)
Per Melbourne Abts PA, pt meets inpatient criteria. Appropriate bed being sought given pt's covid status.

## 2021-05-13 NOTE — ED Notes (Signed)
Pt in TTS session.

## 2021-05-14 DIAGNOSIS — F1410 Cocaine abuse, uncomplicated: Secondary | ICD-10-CM | POA: Diagnosis not present

## 2021-05-14 DIAGNOSIS — U071 COVID-19: Secondary | ICD-10-CM | POA: Diagnosis not present

## 2021-05-14 DIAGNOSIS — F1421 Cocaine dependence, in remission: Secondary | ICD-10-CM | POA: Diagnosis present

## 2021-05-14 DIAGNOSIS — T50902A Poisoning by unspecified drugs, medicaments and biological substances, intentional self-harm, initial encounter: Secondary | ICD-10-CM | POA: Diagnosis not present

## 2021-05-14 MED ORDER — SERTRALINE HCL 50 MG PO TABS: 50.0000 mg | ORAL_TABLET | Freq: Every day | ORAL | Status: DC

## 2021-05-14 NOTE — Consult Note (Signed)
[facility] Face-to-Face Psychiatry Consult   Reason for Consult:  psych consult Referring Physician:  Harvie Heck, PA-C Patient Identification: Janet Silva MRN:  [id number] Principal Diagnosis: Suicide attempt by drug overdose [facility]) Diagnosis:  Principal Problem:   Suicide attempt by drug overdose [facility]) Active Problems:   Cocaine use disorder (HCC)   COVID-19   Total Time spent with patient: 20 minutes  Subjective:   Janet Silva is a 20 y.o. female patient admitted with intentional drug overdose.  On assessment patient presents alert and oriented x3, calm and cooperative. She reports her depression at 7 or 8/10. States she doesn't regret her attempt. She reports increased stress after losing her job this past week, issues with her girlfriend, and ongoing conflict with her mother contributing to her attempt stating the argument with her mother was "the icing on the cake".   She denies any active suicidal or homicidal ideations; no current outpatient supports at this time. Denies any active or history of auditory or visual hallucinations and does not appear to be responding to any external/internal stimuli.   Collateral: Ava Henry Russel (girlfriend) [phone] I'm the one who called. Honestly and truly, [place] being together has improved her a lot. A lot of issues came from her family, with her mom, her dad passed a few years back and a lot of things changed. Recently she lost her job; she's been struggling with getting back to normal. Behaviorally I know she needs help. I do know she has ADHD. States pt has been using cocaine for about a "a year or two"; she told me she hadn't used in a month or two. I honestly feel like she would be safer in my home, I have already spoken to my parents and they are okay with her coming here."   Girlfriend denies any safety concerns at this time; states there are no firearms, medications, or weapons available in the home. Provider discussed [facility] services and the  recommendation for IOP or PHP; discussed COVID status and recommended quarantine measures. Provider explained [facility] open access hours and process; explained the need for patient to report once quarantine is completed. Girlfriend verbalized an understanding and states she is willing/able to support patient in her home. Denies any safety concerns at this time.   HPI:   Janet Silva is a 20 year old female without significant past medical or psychiatric history who presented to [facility] after ingesting 6-7 Diclofenac tabs in a suicide attempt after an alleged argument with her mother and punching the wall. Patient reports increased stress related to recent loss of employment, relationship issues with partner and mother. UDS+ cocaine; patient reports use morning of with "couple of beers". BAL <10. PDMP reviewed; no history noted. Patient is currently COVID+; denies any symptoms.   Past Psychiatric History:    Risk to Self:   Risk to Others:   Prior Inpatient Therapy:   Prior Outpatient Therapy:    Past Medical History: History reviewed. No pertinent past medical history. History reviewed. No pertinent surgical history. Family History: History reviewed. No pertinent family history. Family Psychiatric  History: pt denies any  Social History:  Social History   Substance and Sexual Activity  Alcohol Use Yes   Comment: "every day i drink all of them"     Social History   Substance and Sexual Activity  Drug Use Yes   Frequency: 2.0 times per week   Types: Cocaine    Social History   Socioeconomic History   Marital status: Single  Spouse name: Not on file   Number of children: Not on file   Years of education: Not on file   Highest education level: Not on file  Occupational History   Not on file  Tobacco Use   Smoking status: Never   Smokeless tobacco: Never  Substance and Sexual Activity   Alcohol use: Yes    Comment: "every day i drink all of them"   Drug use: Yes    Frequency: 2.0  times per week    Types: Cocaine   Sexual activity: Not Currently  Other Topics Concern   Not on file  Social History Narrative   Not on file   Social Determinants of Health   Financial Resource Strain: Not on file  Food Insecurity: Not on file  Transportation Needs: Not on file  Physical Activity: Not on file  Stress: Not on file  Social Connections: Not on file   Additional Social History:    Allergies:  No Known Allergies  Labs:  Results for orders placed or performed during the hospital encounter of 05/12/21 (from the past 48 hour(s))  Resp Panel by RT-PCR (Flu A&B, Covid) Nasopharyngeal Swab     Status: Abnormal   Collection Time: 05/12/21 10:26 PM   Specimen: Nasopharyngeal Swab; Nasopharyngeal(NP) swabs in vial transport medium  Result Value Ref Range   SARS Coronavirus 2 by RT PCR POSITIVE (A) NEGATIVE    Comment: CRITICAL RESULT CALLED TO, READ BACK BY AND VERIFIED WITH: Jule Ser [email or web address]  ON 8.25.22 BY TCALDWELL MT. (NOTE) SARS-CoV-2 target nucleic acids are DETECTED.  The SARS-CoV-2 RNA is generally detectable in upper respiratory specimens during the acute phase of infection. Positive results are indicative of the presence of the identified virus, but do not rule out bacterial infection or co-infection with other pathogens not detected by the test. Clinical correlation with patient history and other diagnostic information is necessary to determine patient infection status. The expected result is Negative.  Fact Sheet for Patients: BloggerCourse.com  Fact Sheet for Healthcare Providers: SeriousBroker.it  This test is not yet approved or cleared by the [place] FDA and  has been authorized for detection and/or diagnosis of SARS-CoV-2 by FDA under an Emergency Use Authorization (EUA).  This EUA will remain in effec t (meaning this test can be used) for the duration of  the COVID-19 declaration  under Section 564(b)(1) of the Act, 21 U.S.C. section 360bbb-3(b)(1), unless the authorization is terminated or revoked sooner.     Influenza A by PCR NEGATIVE NEGATIVE   Influenza B by PCR NEGATIVE NEGATIVE    Comment: (NOTE) The Xpert Xpress SARS-CoV-2/FLU/RSV plus assay is intended as an aid in the diagnosis of influenza from Nasopharyngeal swab specimens and should not be used as a sole basis for treatment. Nasal washings and aspirates are unacceptable for Xpert Xpress SARS-CoV-2/FLU/RSV testing.  Fact Sheet for Patients: BloggerCourse.com  Fact Sheet for Healthcare Providers: SeriousBroker.it  This test is not yet approved or cleared by the [place] FDA and has been authorized for detection and/or diagnosis of SARS-CoV-2 by FDA under an Emergency Use Authorization (EUA). This EUA will remain in effect (meaning this test can be used) for the duration of the COVID-19 declaration under Section 564(b)(1) of the Act, 21 U.S.C. section 360bbb-3(b)(1), unless the authorization is terminated or revoked.  Performed at [facility], [address], [place], [place] [address]   CBC     Status: Abnormal   Collection Time: 05/12/21 10:33 PM  Result  Value Ref Range   WBC 8.0 4.0 - 10.5 K/uL   RBC 5.05 3.87 - 5.11 MIL/uL   Hemoglobin 11.4 (L) 12.0 - 15.0 g/dL   HCT 94.4 96.7 - 59.1 %   MCV 73.9 (L) 80.0 - 100.0 fL   MCH 22.6 (L) 26.0 - 34.0 pg   MCHC 30.6 30.0 - 36.0 g/dL   RDW 63.8 (H) 46.6 - 59.9 %   Platelets 273 150 - 400 K/uL   nRBC 0.0 0.0 - 0.2 %    Comment: Performed at [facility], [address], [place], [place] [address]  Comprehensive metabolic panel     Status: Abnormal   Collection Time: 05/12/21 10:33 PM  Result Value Ref Range   Sodium 138 135 - 145 mmol/L   Potassium 3.5 3.5 - 5.1 mmol/L   Chloride 104 98 - 111 mmol/L   CO2 25 22 - 32 mmol/L   Glucose, Bld 72 70 -  99 mg/dL    Comment: Glucose reference range applies only to samples taken after fasting for at least 8 hours.   BUN 15 6 - 20 mg/dL   Creatinine, Ser 7.79 0.44 - 1.00 mg/dL   Calcium 9.4 8.9 - 39.0 mg/dL   Total Protein 8.2 (H) 6.5 - 8.1 g/dL   Albumin 4.2 3.5 - 5.0 g/dL   AST 19 15 - 41 U/L   ALT 12 0 - 44 U/L   Alkaline Phosphatase 69 38 - 126 U/L   Total Bilirubin 0.8 0.3 - 1.2 mg/dL   GFR, Estimated >30 >09 mL/min    Comment: (NOTE) Calculated using the CKD-EPI Creatinine Equation (2021)    Anion gap 9 5 - 15    Comment: Performed at [facility], [address], [place], [place] [address]  Salicylate level     Status: Abnormal   Collection Time: 05/12/21 10:33 PM  Result Value Ref Range   Salicylate Lvl <7.0 (L) 7.0 - 30.0 mg/dL    Comment: Performed at [facility], [address] [place], [place] [address]  Acetaminophen level     Status: Abnormal   Collection Time: 05/12/21 10:33 PM  Result Value Ref Range   Acetaminophen (Tylenol), Serum <10 (L) 10 - 30 ug/mL    Comment: (NOTE) Therapeutic concentrations vary significantly. A range of 10-30 ug/mL  may be an effective concentration for many patients. However, some  are best treated at concentrations outside of this range. Acetaminophen concentrations >150 ug/mL at 4 hours after ingestion  and >50 ug/mL at 12 hours after ingestion are often associated with  toxic reactions.  Performed at [facility], [address], [place], [place] [address]   Ethanol     Status: None   Collection Time: 05/12/21 10:33 PM  Result Value Ref Range   Alcohol, Ethyl (B) <10 <10 mg/dL    Comment: (NOTE) Lowest detectable limit for serum alcohol is 10 mg/dL.  For medical purposes only. Performed at [facility], [address] [place], [place] [address]   Magnesium     Status: None   Collection Time: 05/12/21 10:33 PM  Result Value Ref Range   Magnesium  2.2 1.7 - 2.4 mg/dL    Comment: Performed at [facility], [address] [place], [place] [address]  I-Stat beta hCG blood, ED     Status: None   Collection Time: 05/12/21 10:38 PM  Result Value Ref Range   I-stat hCG, quantitative <  5.0 <5 mIU/mL   Comment 3            Comment:   GEST. AGE      CONC.  (mIU/mL)   <=1 WEEK        5 - 50     2 WEEKS       50 - 500     3 WEEKS       100 - 10,000     4 WEEKS     1,000 - 30,000        FEMALE AND NON-PREGNANT FEMALE:     LESS THAN 5 mIU/mL   Urine rapid drug screen (hosp performed)     Status: Abnormal   Collection Time: 05/12/21 10:44 PM  Result Value Ref Range   Opiates NONE DETECTED NONE DETECTED   Cocaine POSITIVE (A) NONE DETECTED   Benzodiazepines NONE DETECTED NONE DETECTED   Amphetamines NONE DETECTED NONE DETECTED   Tetrahydrocannabinol NONE DETECTED NONE DETECTED   Barbiturates NONE DETECTED NONE DETECTED    Comment: (NOTE) DRUG SCREEN FOR MEDICAL PURPOSES ONLY.  IF CONFIRMATION IS NEEDED FOR ANY PURPOSE, NOTIFY LAB WITHIN 5 DAYS.  LOWEST DETECTABLE LIMITS FOR URINE DRUG SCREEN Drug Class                     Cutoff (ng/mL) Amphetamine and metabolites    1000 Barbiturate and metabolites    200 Benzodiazepine                 200 Tricyclics and metabolites     300 Opiates and metabolites        300 Cocaine and metabolites        300 THC                            50 Performed at [facility], [address] [place], [place] [address]   Basic metabolic panel     Status: Abnormal   Collection Time: 05/13/21  2:37 AM  Result Value Ref Range   Sodium 139 135 - 145 mmol/L   Potassium 3.4 (L) 3.5 - 5.1 mmol/L   Chloride 108 98 - 111 mmol/L   CO2 22 22 - 32 mmol/L   Glucose, Bld 60 (L) 70 - 99 mg/dL    Comment: Glucose reference range applies only to samples taken after fasting for at least 8 hours.   BUN 16 6 - 20 mg/dL   Creatinine, Ser 3.81 0.44 - 1.00 mg/dL   Calcium 8.6 (L)  8.9 - 10.3 mg/dL   GFR, Estimated >01 >75 mL/min    Comment: (NOTE) Calculated using the CKD-EPI Creatinine Equation (2021)    Anion gap 9 5 - 15    Comment: Performed at [facility], [address] [place], [place] [address]  Acetaminophen level     Status: Abnormal   Collection Time: 05/13/21  2:37 AM  Result Value Ref Range   Acetaminophen (Tylenol), Serum <10 (L) 10 - 30 ug/mL    Comment: (NOTE) Therapeutic concentrations vary significantly. A range of 10-30 ug/mL  may be an effective concentration for many patients. However, some  are best treated at concentrations outside of this range. Acetaminophen concentrations >150 ug/mL at 4 hours after ingestion  and >50 ug/mL at 12 hours after ingestion are often associated with  toxic reactions.  Performed at [facility], [address] [place], [place] [address]  CBG monitoring, ED     Status: None   Collection Time: 05/13/21  3:49 AM  Result Value Ref Range   Glucose-Capillary 88 70 - 99 mg/dL    Comment: Glucose reference range applies only to samples taken after fasting for at least 8 hours.    Current Facility-Administered Medications  Medication Dose Route Frequency Provider Last Rate Last Admin   acetaminophen (TYLENOL) tablet 650 mg  650 mg Oral Q4H PRN Petrucelli, Connelly R, PA-C       alum & mag hydroxide-simeth (MAALOX/MYLANTA) 200-200-20 MG/5ML suspension 30 mL  30 mL Oral Q6H PRN Petrucelli, Khristina R, PA-C       ondansetron (ZOFRAN) tablet 4 mg  4 mg Oral Q8H PRN Petrucelli, Myron R, PA-C       zolpidem (AMBIEN) tablet 5 mg  5 mg Oral QHS PRN Petrucelli, Pleas KochSamantha R, PA-C       Current Outpatient Medications  Medication Sig Dispense Refill   diclofenac (VOLTAREN) 75 MG EC tablet Take 1 tablet (75 mg total) by mouth every 12 (twelve) hours as needed for moderate pain. 20 tablet 0   Musculoskeletal: Strength & Muscle Tone: within normal limits Gait & Station:  normal Patient leans: N/A  Psychiatric Specialty Exam:  Presentation  General Appearance:  Casual Eye Contact: Good Speech: Clear and Coherent Speech Volume: Normal Handedness: Right  Mood and Affect  Mood: Depressed; Dysphoric Affect: Congruent; Depressed  Thought Process  Thought Processes: Goal Directed Descriptions of Associations:Intact Orientation:Full (Time, Place and Person) Thought Content:Logical History of Schizophrenia/Schizoaffective disorder:No  Duration of Psychotic Symptoms:No data recorded Hallucinations:Hallucinations: None Ideas of Reference:None Suicidal Thoughts:Suicidal Thoughts: No Homicidal Thoughts:Homicidal Thoughts: No  Sensorium  Memory: Immediate Good; Recent Good; Remote Good Judgment: Poor Insight: Shallow  Executive Functions  Concentration: Good Attention Span: Good Recall: Good Fund of Knowledge: Good Language: Good  Psychomotor Activity  Psychomotor Activity: Psychomotor Activity: Normal  Assets  Assets: Social Support; Resilience; Physical Health; Communication Skills; Desire for Improvement; Financial Resources/Insurance; Housing; Intimacy; Vocational/Educational; Transportation  Sleep  Sleep: Sleep: Good  Physical Exam: Physical Exam Vitals and nursing note reviewed.  Constitutional:      Appearance: She is normal weight.  HENT:     Head: Normocephalic.     Nose: Nose normal.     Mouth/Throat:     Mouth: Mucous membranes are moist.     Pharynx: Oropharynx is clear.  Eyes:     Pupils: Pupils are equal, round, and reactive to light.  Cardiovascular:     Rate and Rhythm: Normal rate.     Pulses: Normal pulses.  Pulmonary:     Effort: Pulmonary effort is normal.  Abdominal:     General: Abdomen is flat.  Musculoskeletal:        General: Normal range of motion.     Cervical back: Normal range of motion.  Skin:    General: Skin is warm and dry.  Neurological:     Mental Status: She is alert  and oriented to person, place, and time. Mental status is at baseline.  Psychiatric:        Attention and Perception: Attention and perception normal.        Mood and Affect: Mood is depressed.        Speech: Speech normal.        Behavior: Behavior is withdrawn. Behavior is cooperative.        Thought Content: Thought content normal. Thought content does not include homicidal or suicidal ideation. Thought content  does not include homicidal or suicidal plan.        Cognition and Memory: Cognition and memory normal.        Judgment: Judgment normal.   Review of Systems  Psychiatric/Behavioral:  Positive for depression. Negative for suicidal ideas.   All other systems reviewed and are negative. Blood pressure 128/82, pulse 60, temperature 97.8 F (36.6 C), temperature source Oral, resp. rate 16, height  (1.702 m), weight 46.3 kg, SpO2 100 %. Body mass index is 15.98 kg/m.  Treatment Plan Summary: Plan Discharge patient home to care of girlfriend with plan to follow up with [facility] for outpatient therapy, medication management; Intensive Outpatient Program, Partial Hospitalization Program referral completed.    Disposition: Patient does not meet criteria for psychiatric inpatient admission. Supportive therapy provided about ongoing stressors. Refer to IOP. Discussed crisis plan, support from social network, calling 911, coming to the Emergency Department, and calling Suicide Hotline.  Loletta Parish, NP 05/14/2021 9:45 AM

## 2021-05-14 NOTE — Progress Notes (Signed)
CSW provided [facility] referrals for the patient to utilize upon discharge.   [facility]-will provide timely access to mental health services for children and adolescents (4-17) and adults presenting in a mental health crisis. The program is designed for those who need urgent Behavioral Health or Substance Use treatment and are not experiencing a medical crisis that would typically require an emergency room visit.    [address] [place], [place] [address] Phone: [phone] [email or web address]   [facility] will also offer the following outpatient services: (Monday through Friday 8am-5pm)   Partial Hospitalization Program (PHP) Substance Abuse Intensive Outpatient Program (SA-IOP) Group Therapy Medication Management Peer Living Room   We also provide (24/7):    Assessments: Our mental health clinician and providers will conduct a focused mental health evaluation, assessing for immediate safety concerns and further mental health needs.   Referral: Our team will provide resources and help connect to community based mental health treatment, when indicated, including psychotherapy, psychiatry, and other specialized behavioral health or substance use disorder services (for those not already in treatment).   Transitional Care: Our team providers in person bridging and/or telphonic follow-up during the patient's transition to outpatient services.       Crissie Reese, MSW, LCSW-A, LCAS-A Phone: [phone] Disposition/TOC

## 2021-05-14 NOTE — Discharge Instructions (Signed)
[facility]-will provide timely access to mental health services for children and adolescents (4-17) and adults presenting in a mental health crisis. The program is designed for those who need urgent Behavioral Health or Substance Use treatment and are not experiencing a medical crisis that would typically require an emergency room visit.    [address] , [place] [address] Phone: [phone] [email or web address]   [facility] will also offer the following outpatient services: (Monday through Friday 8am-5pm)    Partial Hospitalization Program (PHP)  Substance Abuse Intensive Outpatient Program (SA-IOP)  Group Therapy  Medication Management  Peer Living Room   We also provide (24/7):    Assessments: Our mental health clinician and providers will conduct a focused mental health evaluation, assessing for immediate safety concerns and further mental health needs.   Referral: Our team will provide resources and help connect to community based mental health treatment, when indicated, including psychotherapy, psychiatry, and other specialized behavioral health or substance use disorder services (for those not already in treatment).   Transitional Care: Our team providers in person bridging and/or telphonic follow-up during the patient's transition to outpatient services.      

## 2021-05-16 ENCOUNTER — Telehealth (HOSPITAL_COMMUNITY): Payer: Self-pay | Admitting: Licensed Clinical Social Worker

## 2021-07-09 ENCOUNTER — Other Ambulatory Visit: Payer: Self-pay

## 2021-07-09 ENCOUNTER — Emergency Department (HOSPITAL_BASED_OUTPATIENT_CLINIC_OR_DEPARTMENT_OTHER)
Admission: EM | Admit: 2021-07-09 | Discharge: 2021-07-09 | Discharge status: Against Medical Advice | Payer: No Typology Code available for payment source

## 2021-12-24 ENCOUNTER — Ambulatory Visit (HOSPITAL_COMMUNITY)
Admission: EM | Admit: 2021-12-24 | Discharge: 2021-12-26 | Disposition: A | Discharge status: Other Acute Inpatient Hospital | Payer: Medicaid Other | Attending: Psychiatry | Admitting: Psychiatry

## 2021-12-24 DIAGNOSIS — Z9151 Personal history of suicidal behavior: Secondary | ICD-10-CM | POA: Diagnosis not present

## 2021-12-24 DIAGNOSIS — F332 Major depressive disorder, recurrent severe without psychotic features: Secondary | ICD-10-CM | POA: Insufficient documentation

## 2021-12-24 DIAGNOSIS — Z20822 Contact with and (suspected) exposure to covid-19: Secondary | ICD-10-CM | POA: Diagnosis not present

## 2021-12-24 DIAGNOSIS — Z8659 Personal history of other mental and behavioral disorders: Secondary | ICD-10-CM | POA: Diagnosis not present

## 2021-12-24 DIAGNOSIS — R45851 Suicidal ideations: Secondary | ICD-10-CM | POA: Insufficient documentation

## 2021-12-24 DIAGNOSIS — F419 Anxiety disorder, unspecified: Secondary | ICD-10-CM | POA: Insufficient documentation

## 2021-12-24 DIAGNOSIS — F1420 Cocaine dependence, uncomplicated: Secondary | ICD-10-CM | POA: Insufficient documentation

## 2021-12-24 DIAGNOSIS — F1020 Alcohol dependence, uncomplicated: Secondary | ICD-10-CM | POA: Insufficient documentation

## 2021-12-24 LAB — CBC WITH DIFFERENTIAL/PLATELET
Abs Immature Granulocytes: 0.03 10*3/uL (ref 0.00–0.07)
Basophils Absolute: 0.1 10*3/uL (ref 0.0–0.1)
Basophils Relative: 1 %
Eosinophils Absolute: 0.4 10*3/uL (ref 0.0–0.5)
Eosinophils Relative: 4 %
HCT: 35 % — ABNORMAL LOW (ref 36.0–46.0)
Hemoglobin: 11.2 g/dL — ABNORMAL LOW (ref 12.0–15.0)
Immature Granulocytes: 0 %
Lymphocytes Relative: 31 %
Lymphs Abs: 2.5 10*3/uL (ref 0.7–4.0)
MCH: 23.7 pg — ABNORMAL LOW (ref 26.0–34.0)
MCHC: 32 g/dL (ref 30.0–36.0)
MCV: 74.2 fL — ABNORMAL LOW (ref 80.0–100.0)
Monocytes Absolute: 1 10*3/uL (ref 0.1–1.0)
Monocytes Relative: 12 %
Neutro Abs: 4.1 10*3/uL (ref 1.7–7.7)
Neutrophils Relative %: 52 %
Platelets: 236 10*3/uL (ref 150–400)
RBC: 4.72 MIL/uL (ref 3.87–5.11)
RDW: 18.4 % — ABNORMAL HIGH (ref 11.5–15.5)
WBC: 8.1 10*3/uL (ref 4.0–10.5)
nRBC: 0 % (ref 0.0–0.2)

## 2021-12-24 LAB — HEMOGLOBIN A1C
Hgb A1c MFr Bld: 4.8 % (ref 4.8–5.6)
Mean Plasma Glucose: 91.06 mg/dL

## 2021-12-24 LAB — COMPREHENSIVE METABOLIC PANEL
ALT: 10 U/L (ref 0–44)
AST: 14 U/L — ABNORMAL LOW (ref 15–41)
Albumin: 3.8 g/dL (ref 3.5–5.0)
Alkaline Phosphatase: 68 U/L (ref 38–126)
Anion gap: 7 (ref 5–15)
BUN: 17 mg/dL (ref 6–20)
CO2: 23 mmol/L (ref 22–32)
Calcium: 9.2 mg/dL (ref 8.9–10.3)
Chloride: 108 mmol/L (ref 98–111)
Creatinine, Ser: 0.85 mg/dL (ref 0.44–1.00)
GFR, Estimated: >60 mL/min (ref >60)
Glucose, Bld: 69 mg/dL — ABNORMAL LOW (ref 70–99)
Potassium: 4.1 mmol/L (ref 3.5–5.1)
Sodium: 138 mmol/L (ref 135–145)
Total Bilirubin: 0.5 mg/dL (ref 0.3–1.2)
Total Protein: 7.3 g/dL (ref 6.5–8.1)

## 2021-12-24 LAB — POCT URINE DRUG SCREEN - MANUAL ENTRY (I-SCREEN)
POC Amphetamine UR: NOT DETECTED
POC Buprenorphine (BUP): NOT DETECTED
POC Cocaine UR: POSITIVE — AB
POC Marijuana UR: POSITIVE — AB
POC Methadone UR: NOT DETECTED
POC Methamphetamine UR: NOT DETECTED
POC Morphine: NOT DETECTED
POC Oxazepam (BZO): NOT DETECTED
POC Oxycodone UR: NOT DETECTED
POC Secobarbital (BAR): NOT DETECTED

## 2021-12-24 LAB — URINALYSIS, ROUTINE W REFLEX MICROSCOPIC
Bacteria, UA: NONE SEEN
Bilirubin Urine: NEGATIVE
Glucose, UA: NEGATIVE mg/dL
Hgb urine dipstick: NEGATIVE
Ketones, ur: NEGATIVE mg/dL
Nitrite: NEGATIVE
Protein, ur: NEGATIVE mg/dL
Specific Gravity, Urine: 1.024 (ref 1.005–1.030)
pH: 6 (ref 5.0–8.0)

## 2021-12-24 LAB — LIPID PANEL
Cholesterol: 161 mg/dL (ref 0–200)
HDL: 61 mg/dL (ref >40)
LDL Cholesterol: 88 mg/dL (ref 0–99)
Total CHOL/HDL Ratio: 2.6 RATIO
Triglycerides: 61 mg/dL (ref <150)
VLDL: 12 mg/dL (ref 0–40)

## 2021-12-24 LAB — TSH: TSH: 1.08 u[IU]/mL (ref 0.350–4.500)

## 2021-12-24 LAB — RESP PANEL BY RT-PCR (FLU A&B, COVID) ARPGX2
Influenza A by PCR: NEGATIVE
Influenza B by PCR: NEGATIVE
SARS Coronavirus 2 by RT PCR: NEGATIVE

## 2021-12-24 LAB — POC SARS CORONAVIRUS 2 AG: SARSCOV2ONAVIRUS 2 AG: NEGATIVE

## 2021-12-24 LAB — PREGNANCY, URINE: Preg Test, Ur: NEGATIVE

## 2021-12-24 LAB — POCT PREGNANCY, URINE: Preg Test, Ur: NEGATIVE

## 2021-12-24 LAB — ETHANOL: Alcohol, Ethyl (B): <10 mg/dL (ref <10)

## 2021-12-24 LAB — MAGNESIUM: Magnesium: 1.9 mg/dL (ref 1.7–2.4)

## 2021-12-24 MED ORDER — THIAMINE HCL 100 MG PO TABS
100.0000 mg | ORAL_TABLET | Freq: Every day | ORAL | Status: DC
  Administered 2021-12-25 – 2021-12-26 (×2): 100 mg via ORAL
  Filled 2021-12-24 (×2): qty 1

## 2021-12-24 MED ORDER — HYDROXYZINE HCL 25 MG PO TABS: 25.0000 mg | ORAL_TABLET | Freq: Four times a day (QID) | ORAL | Status: DC | PRN

## 2021-12-24 MED ORDER — ACETAMINOPHEN 325 MG PO TABS: 650.0000 mg | ORAL_TABLET | Freq: Four times a day (QID) | ORAL | Status: DC | PRN

## 2021-12-24 MED ORDER — MAGNESIUM HYDROXIDE 400 MG/5ML PO SUSP: 30.0000 mL | Freq: Every day | ORAL | Status: DC | PRN

## 2021-12-24 MED ORDER — LOPERAMIDE HCL 2 MG PO CAPS: 2.0000 mg | ORAL_CAPSULE | ORAL | Status: DC | PRN

## 2021-12-24 MED ORDER — ADULT MULTIVITAMIN W/MINERALS CH
1.0000 | ORAL_TABLET | Freq: Every day | ORAL | Status: DC
  Administered 2021-12-25 – 2021-12-26 (×2): 1 via ORAL
  Filled 2021-12-24 (×2): qty 1

## 2021-12-24 MED ORDER — LORAZEPAM 1 MG PO TABS
1.0000 mg | ORAL_TABLET | Freq: Four times a day (QID) | ORAL | Status: DC | PRN
  Administered 2021-12-25: 1 mg via ORAL
  Filled 2021-12-24: qty 1

## 2021-12-24 MED ORDER — TRAZODONE HCL 50 MG PO TABS
50.0000 mg | ORAL_TABLET | Freq: Every evening | ORAL | Status: DC | PRN
  Administered 2021-12-25: 50 mg via ORAL
  Filled 2021-12-24: qty 1

## 2021-12-24 MED ORDER — HYDROXYZINE HCL 25 MG PO TABS: 25.0000 mg | ORAL_TABLET | Freq: Three times a day (TID) | ORAL | Status: DC | PRN

## 2021-12-24 MED ORDER — ONDANSETRON 4 MG PO TBDP: 4.0000 mg | ORAL_TABLET | Freq: Four times a day (QID) | ORAL | Status: DC | PRN

## 2021-12-24 MED ORDER — ALUM & MAG HYDROXIDE-SIMETH 200-200-20 MG/5ML PO SUSP: 30.0000 mL | ORAL | Status: DC | PRN

## 2021-12-24 NOTE — Discharge Instructions (Addendum)
Transfer to [facility] for IP psychiatric admission  ? ?Resources for after hospitalization  ?Please contact one of the following facilities to start medication management and therapy services:  ? ?[facility] ?[address]  ?[place], [place] [address] ?[phone]  ? ?[facility]  ?[address] ?[place], [place] [address] ?[phone] ? ?[facility]  ?[address], [place], [place] [address] ?[phone] ? ?[facility]  ?[id number]  ?[place], [place] [address] ?[phone] ? ?[facility]  ?[address] ?[place], [place] [address] ?[phone] ? ?[facility]  ?[address],  ?[place], [place] [address] ?[phone]) [phone]  ? ? ?Substance Abuse Treatment Programs ? ?Intensive Outpatient Programs ?[facility]     ?[address]      ?[place], [place]                   ?[phone]      ? ?[facility] ?[address] ?[place], [place] ?[phone] ? ?[facility]     ?(Inpatient and outpatient)     ?[address]           ?[phone]   ? ?[facility] ?[phone] (Suboxone and Methadone) ? ?[address]      ?[place], [place] [address]      ?[phone]      ? ?[address] ?[place], [place] ?[phone] ? ?[facility] (Outpatient/Inpatient, [facility])    ?(insurance only) [phone]      ?       ?[facility] (Groups & Residential) ?[place], [place] ?[phone] ? ?   ?[facility]     ?[address]     ?[place], [place]      ?[phone]      ? ?[facility] (for caregivers and family) ?[address] ?[place], [place] ?[phone] ? ? ? ? ? ?Residential Treatment Programs ?[facility]      ?[address] [place] [address], [place], [place] [address]  ?[phone]      ? ?[facility] ?[address]  , [place] [address] ?[phone] ? ?[facility]        ?[phone]      ? ?[facility] ?[phone] ? ?[facility]             ?[address]                                         ?[place], [place]                                                ?[phone] or [phone]                              ? ?[facility] ?[address] ?[place], [address] ?[phone] ? ?[facility]    ?[address]      ?[place], [place]     ?[phone]      ? ?[facility] ?[address] ?[place], [place] ?[phone] ? ?[facility]   ?[address]     ?[place], [place] [address]     ?[phone]      ?Admissions: 8am-3pm M-F ? ?[facility] ([facility]) ?[address] ?[place], [place] ?[phone] ? ?[facility]: Residential Program (90 Days)   ?[place], [place]      ?[phone] or [phone]    ? ?[facility] ?[place], [place] ?(Walk in Hours over the weekend or by referral) ? ?[facility] ?[address] [place], [place], [place] [address] ?[phone] ? ?Crisis Mobile: [facility]:  6045843155 (for crisis response 24 hours a day) ?[facility] Hotline:      3138296632 ?Outpatient Psychiatry and Counseling ? ?[facility]: Mobile Crisis Management 24 hours:  [phone] ? ?[facility] [employer] sliding scale fee and walk in schedule: M-F 8am-12pm/1pm-3pm ?[address]  ?[place], [place] [address] ?[phone] ? ?[facility] ?[address] ?[place], [place] [address] ?[phone] ? ?[facility] new patient walk-in appointments available Monday - Friday 8am -3pm.          ?[address] ?[place], [place] [address] ?[phone] or crisis line- [phone] ? ?[facility]/ Intensive Outpatient Therapy  Program ?[address] ?[place], [place] [address] ?[phone] ? ?[facility]                  ?Crisis Services      ?[phone]      ?[address]     ?[place], [place] [address]                ? ?[facility]   ?[facility] ?[phone] ?[address] ?[place], [place] [address] ? ? ?[facility]          ?[address],  ?[place], [place] [address]       ?[phone] ? ?[facility] ?[address] ?[place], [place] [address] ?[phone] ? ?[facility]    ?[address]    ?[place], [place] [address]     ?[phone]      ? ?Andee Poles, MD     ?[phone]     ?[place] [address]     ?[phone]     ?  ?[facility] ?[address] ?[place] [address] ? ?[facility]     ?[address]     ?[place], [place]      ?[phone]      ? ?[facility] ?Eulogio Ditch, MD ?[address] ?[place], [place] [address] ?[phone] ? ?[facility]     ?[address] [phone]     ?[place], [place] [address]     ?[phone]      ? ?[facility] ?[address] [place], [place] [address] ?[phone] ?Resources for Temporary Residential Assistance/Crisis Centers ? ?[facility] ?[facility]) ?M-F 8am-3pm   ?[address] [place], [place] [address]   [phone] ?Services include: laundry, barbering, support groups, case management, phone  & computer access, showers, AA/NA mtgs, mental health/substance abuse nurse, job skills class, disability information, VA assistance, spiritual classes, etc.  ? ?[facility] SHELTERS ? ?[employer]     ?[facility]   ?[address] [place]     ?[phone]       ?       ?[facility] (women and children)       ?[address] [place], [place] [address] ?[phone] ?[email or web address] for application and  process ?Application Required ? ?[facility]   ?[address]    ?[place] [address]     ?[phone]       ?             ?[facility] ?[address] ?[place], [place] [address] ?[phone] ?[phone](ALPFXTKW application appt.) ?Application Required ? ?[facility] (women only)    ?[address]     ?[place], [place] [address]     ?[phone]      ?Intake starts 6pm daily ?Need valid ID, SSC, & Police report ?Holiday representative [employer] ?[address] ?[place], [place] ?[phone] ?Application Required ? ?[facility] (men only)     ?[address]      [place], [place]     ?[phone]      ? ?[facility] ?(Pregnant women only) ?[address] [place], [place] ?[phone] ? ?[facility]      ?[address]      [place], [place] [address]     ?[phone]      ?       ?[facility] ?[address] ?[place], [place] ?[phone] ?90 day commitment/SA/Application process ? ?[facility](men only)     ?[address]     ?[place], [place]     ?[phone]       ?Check-in at 7pm     ?       ?[facility] ?[address] ?[place], [place] [address] ?[phone] ?Men/Women/Women and Children must be there by 7 pm ? ?Holiday representative ?[place], [place] ?[phone]                ? ?

## 2021-12-24 NOTE — Progress Notes (Signed)
Patient admitted to the observation unit due to self harming thoughts with a plan to overdose on cocaine. Patient states she drinks a fifth of brown/ white liquor daily. Patient denies withdrawal symptoms at this time. No tremors observed at time of admission. Patient is alert and oriented X 4, pleasant and cooperative. Nurse will report off to next shift. ?

## 2021-12-24 NOTE — ED Triage Notes (Signed)
Pt presents to [facility] accompanied by her grandfather. Pt states that she started having passive SI after relapsing on cocaine 2 months ago. Pt states that she has no plan to harm herself, she has just been having a hard time since her relapse. Pt states that she would like to get set up with outpatient services and get resources for substance use treatment. Pt denies HI and AVH. ?

## 2021-12-24 NOTE — BH Assessment (Signed)
Comprehensive Clinical Assessment (CCA) Note ? ?12/24/2021 ?Janet Silva ?[id number] ? ?DISPOSITION: Chana Bode NP recommends a inpatient admission to assist with stabilization.  ? ?[place] [facility] from 12/24/2021 in [facility] ED from 05/12/2021 in [place] DEPT ED from 04/10/2021 in [facility] at [facility]  ?C-SSRS RISK CATEGORY High Risk High Risk No Risk  ? ?  ? The patient demonstrates the following risk factors for suicide: Chronic risk factors for suicide include: substance use disorder. Acute risk factors for suicide include:  depression . Protective factors for this patient include: coping skills. Considering these factors, the overall suicide risk at this point appears to be high. Patient is not appropriate for outpatient follow up.  ? ?Patient is a 21 year old female with a history significant for Depression, Alcohol Use Disorder and Cocaine Use. Patient presents this date as a voluntary walk in to [facility] with ongoing S/I. Patient voices a plan to overdose on cocaine. Patient denies any H/I or AVH. Patient states she has not received any OP services in over a year and cannot recall where she last received those services. Patient also reports she relapsed in February 2023 and has been using cocaine daily since then in various amounts. Patient states she usually uses a gram every other day with last use prior to arrival when she reported she used "about a gram." Patient states she also consumes various amounts of alcohol daily using up to a pint to a fifth of liquor every 2 to 3 days. Patient denies any current withdrawals. Patient times a week  Patient states she currently resides with family and is in a ongoing relationship with her partner (girlfriend) of 2 years. Patient reports current stressors to include: frequent altercations with her mother and communication issues with her partner. Patient states her depression has worsened in the  last month with symptoms to include: feeling hopeless and worthless. Patient states she is disappointed in herself for relapsing also. Patient denies any history of abuse or access to firearms.  Patient has limited insight into her current substance use concerns although states this date she is intent on trying to maintain her sobriety. Patient was last seen and assessed on 05/13/21 when she presented with similar symptoms and met inpatient criteria at that time.  ? ?Patient is oriented x 4. Patient is alert and speaks with normal tone and volume. Patient's thoughts are organized with memory intact. Patient's mood is depressed with affect congruent. Patient does not appear to be responding to internal stimuli.   ?Chief Complaint: No chief complaint on file. ? 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 ? ?CCA Screening, Triage and Referral (STR) ? ?Patient Reported Information ?How did you hear about [place]? Self ? ?What Is the Reason for Your Visit/Call Today? Pt reports ongoing S/I with a plan to overdose on cocaine. ? ?How Long Has This Been Causing You Problems? 1 wk - 1 month ? ?What Do You Feel Would Help You the Most Today? Alcohol or Drug Use Treatment; Treatment for Depression or other mood problem ? ? ?Have You Recently Had Any Thoughts About Hurting Yourself? Yes ? ?Are You Planning to Commit Suicide/Harm Yourself At This time? No ? ? ?Have you Recently Had Thoughts About Portal? No ? ?Are You Planning to Harm Someone at This Time? No ? ?Explanation: No data recorded ? ?Have You Used Any Alcohol or Drugs in the Past  24 Hours? Yes ? ?How Long Ago Did You Use Drugs or Alcohol? No data recorded ?What Did You Use and How Much? Pt reports cocaine use in the last 24 hours ? ? ?Do You Currently Have a Therapist/Psychiatrist? No ? ?Name of Therapist/Psychiatrist: No data recorded ? ?Have You Been Recently Discharged From Any Office Practice or  Programs? No ? ?Explanation of Discharge From Practice/Program: No data recorded ? ?  ?CCA Screening Triage Referral Assessment ?Type of Contact: Face-to-Face ? ?Telemedicine Service Delivery:   ?Is this Initial or Reassessment? Initial Assessment ? ?Date Telepsych consult ordered in [facility]:  05/13/21 ? ?Time Telepsych consult ordered in [facility]:  [place] ? ?Location of Assessment: [facility] ? ?Provider Location: [facility] Assessment Services ? ? ?Collateral Involvement: None at this time ? ? ?Does Patient Have a Stage manager Guardian? No data recorded ?Name and Contact of Legal Guardian: No data recorded ?If Minor and Not Living with Parent(s), Who has Custody? NA ? ?Is CPS involved or ever been involved? Never ? ?Is APS involved or ever been involved? Never ? ? ?Patient Determined To Be At Risk for Harm To Self or Others Based on Review of Patient Reported Information or Presenting Complaint? Yes, for Self-Harm ? ?Method: No data recorded ?Availability of Means: No data recorded ?Intent: No data recorded ?Notification Required: No data recorded ?Additional Information for Danger to Others Potential: No data recorded ?Additional Comments for Danger to Others Potential: No data recorded ?Are There Guns or Other Weapons in [place]? No data recorded ?Types of Guns/Weapons: No data recorded ?Are These Weapons Safely Secured?                            No data recorded ?Who Could Verify You Are Able To Have These Secured: No data recorded ?Do You Have any Outstanding Charges, Pending Court Dates, Parole/Probation? No data recorded ?Contacted To Inform of Risk of Harm To Self or Others: Other: Comment (NA) ? ? ? ?Does Patient Present under Involuntary Commitment? No ? ?IVC Papers Initial File Date: No data recorded ? ?[place] of Residence: Kathleen Argue ? ? ?Patient Currently Receiving the Following Services: Not Receiving Services ? ? ?Determination of Need: Urgent (48 hours) ? ? ?Options For Referral:  Inpatient Hospitalization ? ? ? ? ?CCA Biopsychosocial ?Patient Reported Schizophrenia/Schizoaffective Diagnosis in Past: No ? ? ?Strengths: Pt is willing to participate in treatment ? ? ?Mental Health Symptoms ?Depression:   ?Change in energy/activity; Hopelessness ?  ?Duration of Depressive symptoms:  ?Duration of Depressive Symptoms: Greater than two weeks ?  ?Mania:   ?None ?  ?Anxiety:    ?Difficulty concentrating ?  ?Psychosis:   ?None ?  ?Duration of Psychotic symptoms:    ?Trauma:   ?None (none reported) ?  ?Obsessions:   ?None ?  ?Compulsions:   ?None ?  ?Inattention:   ?N/A ?  ?Hyperactivity/Impulsivity:   ?N/A ?  ?Oppositional/Defiant Behaviors:   ?N/A ?  ?Emotional Irregularity:   ?Chronic feelings of emptiness; Potentially harmful impulsivity ?  ?Other Mood/Personality Symptoms:   ?NA ?  ? ?Mental Status Exam ?Appearance and self-care  ?Stature:   ?Average ?  ?Weight:   ?Underweight ?  ?Clothing:   ?Casual ?  ?Grooming:   ?Normal ?  ?Cosmetic use:   ?None ?  ?Posture/gait:   ?Normal ?  ?Motor activity:   ?Not Remarkable ?  ?Sensorium  ?Attention:   ?Normal ?  ?Concentration:   ?  Normal ?  ?Orientation:   ?X5 ?  ?Recall/memory:   ?Normal ?  ?Affect and Mood  ?Affect:   ?Appropriate; Depressed ?  ?Mood:   ?Depressed; Anxious ?  ?Relating  ?Eye contact:   ?Normal ?  ?Facial expression:   ?Depressed; Responsive ?  ?Attitude toward examiner:   ?Cooperative ?  ?Thought and Language  ?Speech flow:  ?Clear and Coherent ?  ?Thought content:   ?Appropriate to Mood and Circumstances ?  ?Preoccupation:   ?None ?  ?Hallucinations:   ?None ?  ?Organization:  No data recorded  ?Executive Functions  ?Fund of Knowledge:   ?Average ?  ?Intelligence:   ?Average ?  ?Abstraction:   ?Normal ?  ?Judgement:   ?Fair ?  ?Reality Testing:   ?Adequate ?  ?Insight:   ?Poor ?  ?Decision Making:   ?Impulsive ?  ?Social Functioning  ?Social Maturity:   ?Responsible ?  ?Social Judgement:   ?Normal ?  ?Stress  ?Stressors:   ?Family  conflict; Relationship ?  ?Coping Ability:   ?Overwhelmed ?  ?Skill Deficits:   ?Communication; Decision making ?  ?Supports:   ?Family ?  ? ? ?Religion: ?Religion/Spirituality ?Are You A Religious Person?: No ? ?Leisure/R

## 2021-12-24 NOTE — ED Notes (Signed)
Pt resting at present, no distress noted, calm & cooperative.  Respirations even & unlabored.  Monitoring for safety. ?

## 2021-12-24 NOTE — ED Notes (Signed)
Pt A&O x 4, no distress noted, calm & cooperative.  Watching TV at present.  Monitoring for safety. 

## 2021-12-24 NOTE — ED Provider Notes (Signed)
Behavioral Health Admission H&P ?(FBC & OBS) ? ?Date: 12/24/21 ?Patient Name: Janet Silva ?MRN: [id number] ?Chief Complaint: No chief complaint on file. ?   ? ?Diagnoses:  ?Final diagnoses:  ?Severe episode of recurrent major depressive disorder, without psychotic features ([place])  ? ? ?HPI: patient presented to [facility] as a walk in accompanied by her Grandfather with complaints of increased depression, anxiety and SI.  ? ?Janet Silva, 21 y.o., female patient seen face to face by this provider, consulted with Dr. Lovette Cliche ; and chart reviewed on 12/24/21.  Patient reports she has a past psychiatric history of depression. She has been prescribed medications in the past but does not remember the names. States she did not take them long because she did not like how they made her feel. She has a history of one suicide attempt by overdose. States she was admitted to the hospital, but deny's being admitted to a psychiatric hospital.  ? ?Per CCA note by Viviana Simpler LCAS on 12/24/2021 ? ?"Patient is a 21 year old female with a history significant for Depression, Alcohol Use Disorder and Cocaine Use. Patient presents this date as a voluntary walk in to [facility] with ongoing S/I. Patient voices a plan to overdose on cocaine. Patient denies any H/I or AVH. Patient states she has not received any OP services in over a year and cannot recall where she last received those services. Patient also reports she relapsed in February 2023 and has been using cocaine daily since then in various amounts. Patient states she usually uses a gram every other day with last use prior to arrival when she reported she used "about a gram." Patient states she also consumes various amounts of alcohol daily using up to a pint to a fifth of liquor every 2 to 3 days. Patient denies any current withdrawals. Patient times a week  Patient states she currently resides with family and is in a ongoing relationship with her partner (girlfriend) of 2 years.  Patient reports current stressors to include: frequent altercations with her mother and communication issues with her partner. Patient states her depression has worsened in the last month with symptoms to include: feeling hopeless and worthless. Patient states she is disappointed in herself for relapsing also. Patient denies any history of abuse or access to firearms.  Patient has limited insight into her current substance use concerns although states this date she is intent on trying to maintain her sobriety. Patient was last seen and assessed on 05/13/21 when she presented with similar symptoms and met inpatient criteria at that time".  ? ?During evaluation Janet Silva is in siting position in no acute distress.  She is alert/oriented x 4; calm/cooperative She is tearful during the assessment. She is speaking in a clear tone at moderate volume, and normal pace; with good eye contact. She endorses depression with feelings of hopelessness, worthlessness, guilt, decreased appetite and decreased motivation. She has a depressed affect. She sleeps 5-6 hours per night.  Objectively there  no indication that she is currently responding to internal/external stimuli or experiencing delusional thought content. She denies AVH and HI.  She endorses SI with a plan to overdose. States she purposefully uses alcohol and cocoaine in the hopes that she overdoses. When asked if she wants to die she states, "yes". She can not contract for safety.  ? ?Discussed IP psychiatric admission and patient is in agreement.  ?  ?  ? ?PHQ 2-9:  ?Flowsheet Row ED from 05/12/2021 in [place]  [place] [facility]  ?Thoughts that you would be better off dead, or of hurting yourself in some way Several days  ?PHQ-9 Total Score 15  ? ?  ?  ?[place] ED from 12/24/2021 in [facility] ED from 05/12/2021 in [place] DEPT ED from 04/10/2021 in [facility]   ?C-SSRS RISK CATEGORY High Risk High Risk No Risk  ? ?  ?  ? ?Total Time spent with patient: 30 minutes ? ?Musculoskeletal  ?Strength & Muscle Tone: within normal limits ?Gait & Station: normal ?Patient leans: N/A ? ?Psychiatric Specialty Exam  ?Presentation ?General Appearance: Appropriate for Environment; Fairly Groomed ? ?Eye Contact:Good ? ?Speech:Clear and Coherent; Normal Rate ? ?Speech Volume:Normal ? ?Handedness:Right ? ? ?Mood and Affect  ?Mood:Anxious; Depressed ? ?Affect:Tearful; Congruent; Depressed ? ? ?Thought Process  ?Thought Processes:Coherent ? ?Descriptions of Associations:Intact ? ?Orientation:Full (Time, Place and Person) ? ?Thought Content:Logical ? Diagnosis of Schizophrenia or Schizoaffective disorder in past: No ?  ?Hallucinations:Hallucinations: None ? ?Ideas of Reference:None ? ?Suicidal Thoughts:Suicidal Thoughts: Yes, Active ?SI Active Intent and/or Plan: With Intent; With Plan; With Means to Carry Out ? ?Homicidal Thoughts:Homicidal Thoughts: No ? ? ?Sensorium  ?Memory:Immediate Good; Recent Good; Remote Good ? ?Judgment:Poor ? ?Insight:Poor ? ? ?Executive Functions  ?Concentration:Good ? ?Attention Span:Good ? ?Recall:Good ? ?Fund of [place] ? ?Language:Good ? ? ?Psychomotor Activity  ?Psychomotor Activity:Psychomotor Activity: Normal ? ? ?Assets  ?Assets:Communication Skills; Desire for Improvement; Financial Resources/Insurance; Leisure Time; Physical Health; Resilience; Vocational/Educational ? ? ?Sleep  ?Sleep:Sleep: Fair ?Number of Hours of Sleep: 6 ? ? ?Nutritional Assessment (For OBS and FBC admissions only) ?Has the patient had a weight loss or gain of 10 pounds or more in the last 3 months?: No ?Has the patient had a decrease in food intake/or appetite?: Yes ?Does the patient have dental problems?: No ?Does the patient have eating habits or behaviors that may be indicators of an eating disorder including binging or inducing vomiting?: No ?Has the patient recently lost  weight without trying?: 2.0 ?Has the patient been eating poorly because of a decreased appetite?: 1 ?Malnutrition Screening Tool Score: 3 ? ? ? ?Physical Exam ?Vitals and nursing note reviewed.  ?Constitutional:   ?   General: She is not in acute distress. ?   Appearance: Normal appearance. She is not ill-appearing.  ?HENT:  ?   Head: Normocephalic.  ?Eyes:  ?   General:     ?   Right eye: No discharge.     ?   Left eye: No discharge.  ?Pulmonary:  ?   Effort: Pulmonary effort is normal. No respiratory distress.  ?Musculoskeletal:     ?   General: Normal range of motion.  ?   Cervical back: Normal range of motion.  ?Skin: ?   Coloration: Skin is not jaundiced or pale.  ?Neurological:  ?   Mental Status: She is alert and oriented to person, place, and time.  ?Psychiatric:     ?   Attention and Perception: Attention and perception normal.     ?   Mood and Affect: Mood is anxious and depressed. Affect is tearful.     ?   Speech: Speech normal.     ?   Behavior: Behavior is cooperative.     ?   Thought Content: Thought content includes suicidal ideation. Thought content includes suicidal plan.     ?   Cognition and Memory: Cognition normal.     ?  Judgment: Judgment is impulsive.  ? ?Review of Systems  ?Constitutional: Negative.   ?HENT: Negative.    ?Eyes: Negative.   ?Respiratory: Negative.    ?Cardiovascular: Negative.   ?Musculoskeletal: Negative.   ?Skin: Negative.   ?Neurological: Negative.   ?Psychiatric/Behavioral:  Positive for depression and suicidal ideas. The patient is nervous/anxious.   ? ?Blood pressure 124/67, pulse 71, temperature 97.7 ?F (36.5 ?C), temperature source Oral, resp. rate 16, SpO2 100 %. There is no height or weight on file to calculate BMI. ? ?Past Psychiatric History: self reported depression and substance use  ? ?Is the patient at risk to self? Yes  ?Has the patient been a risk to self in the past 6 months? Yes .    ?Has the patient been a risk to self within the distant past? Yes   ?Is  the patient a risk to others? No   ?Has the patient been a risk to others in the past 6 months? No   ?Has the patient been a risk to others within the distant past? No  ? ?Past Medical History: No past m

## 2021-12-25 LAB — RPR: RPR Ser Ql: NONREACTIVE

## 2021-12-25 NOTE — ED Notes (Signed)
Pt is resting quietly. Appears in no acute distress at this time. Report received from off going RN. Pt is safe, will con't to monitor.  ?

## 2021-12-25 NOTE — ED Notes (Addendum)
Pt informed she has been accepted to [facility] for tomorrow at 8 am, to the service of Dr. Landry Mellow. Spoke with April in admissions, pt does not have to sign a voluntary form prior to arrival, she will sign at arrival to their facility.  ? ?Per Orlie Pollen - ?Per Salen, admissions, pt has been accepted to [facility], main campus. Accepting provider is Dr. Jola Babinski. Patient can arrive 12/26/2021 after 8:00am. Number for report is [phone] ?

## 2021-12-25 NOTE — ED Notes (Signed)
Pt sleeping at present, no distress noted,  Respirations even & unlabored.  Monitoring for safety. °

## 2021-12-25 NOTE — ED Notes (Signed)
Pt up oob to bathroom. Meal provided of roast beef and potatos, beans, patient ate 100%. Pt denies any acute concerns, no signs of any acute alcohol withdrawal at this time.  ?

## 2021-12-25 NOTE — ED Notes (Signed)
Pt A&O x 4, sleeping at present, no distress noted, calm & cooperative, respirations even & unlabored.  Monitoring for safety.  Pending [facility] in am. ?

## 2021-12-25 NOTE — Progress Notes (Signed)
Per Julaine Fusi, patient meets criteria for inpatient treatment. There are no available or appropriate beds at [facility] today. CSW faxed referrals to the following facilities for review: ? ?[facility]  Pending - Request Sent N/A [address] [place] [address] [phone] --  ?[facility]  Pending - Request Sent N/A [address] [place] [address] [phone] --  ?[facility]  Pending - Request Sent N/A [address] [place] [address] [phone] --  ?[facility]  Pending - Request Sent N/A [address] [place] [address] [phone] --  ?[facility]  Pending - Request Sent N/A [address] [place] [address] [phone] --  ?[facility]  Pending - Request Sent N/A [address] [place] [address] [phone] --  ?[facility]  Pending - Request Sent N/A [address] [place] [address] [phone] --  ?[facility]  Pending - Request Sent N/A [address] [place] [address] [phone] --  ?[facility]  Pending - Request Sent N/A [address] [place] [address] [phone] --  ?[facility]  Pending - Request Sent N/A [address] [place] [address] [phone] --  ?[facility]  Pending - Request Sent N/A [address] [place] [address] [phone] --  ?[facility]  Pending - Request Sent N/A [address], [place] [address] [phone] --  ?[facility]  Pending - Request Sent N/A [address] [place] [address] [phone] --  ? ?TTS will continue to seek bed placement. ? ?Crissie Reese, MSW, LCSW-A, LCAS-A ?Phone: [phone] ?Disposition/TOC ? ?

## 2021-12-25 NOTE — ED Notes (Signed)
Pt up to use the phone. Pt in no acute distress at this time.  ?

## 2021-12-25 NOTE — Progress Notes (Signed)
CSW spoke with Salen with [facility] in reference to placement. CSW facilitated Salen speaking with the patient's nurse. It was reported that the patient referral will be reviewed with the provider for placement. ? ?Crissie Reese, MSW, LCSW-A, LCAS-A ?Phone: [phone] ?Disposition/TOC ?  ?

## 2021-12-25 NOTE — Progress Notes (Signed)
Per Salen, admissions, pt has been accepted to [facility], main campus. Accepting provider is Dr. Jola Babinski. Patient can arrive 12/26/2021 after 8:00am. Number for report is [phone]) [phone]. ? ? ?Crissie Reese, MSW, LCSW-A ?Phone: [phone] ?Disposition/TOC ? ? ?

## 2021-12-25 NOTE — ED Notes (Signed)
Pt tearful, crying, anxious about going to [facility] in am.  Pt requesting to seek outpatient help instead of going inpatient.  NP Nira Conn at bedside to speak with pt. ?

## 2021-12-25 NOTE — ED Notes (Signed)
Pt up to shower, hygiene products provided. Snack provided. Pt calm, cooperative, in no acute distress at this time. Will con't to monitor.  ?

## 2021-12-25 NOTE — ED Provider Notes (Signed)
FBC/OBS ASAP Discharge Summary ? ?Date and Time: 12/25/2021 3:20 PM  ?Name: Janet Silva  ?MRN:  [id number]  ? ?Discharge Diagnoses:  ?Final diagnoses:  ?Severe episode of recurrent major depressive disorder, without psychotic features (HCC)  ? ? ?Subjective: patient initially presented to [facility] on 12/24/2021 as a walk in accompanied by her Grandfather with complaints of increased depression, anxiety and SI. She was admitted to the continuous assessment unit while waiting inpatient psychiatric bed availability.  ?  ?Eugenie Filler, 21 y.o., female patient seen face to face by this provider, consulted with Dr. Gasper Sells ; and chart reviewed on 12/25/21.  Patient reports she has a past psychiatric history of depression. She does not take any medications. She maybe interested in restarting medications but is unsure at this time. Educated she can also discuss medications with provider at IP facility.  ? ?On assessment patient is observed sitting in her bed. She is alert/oriented.  She is cooperative. Her speech is clear, coherent, normal rate and tone. She makes good eye contact. She continues to endorse increased depression and anxiety. She has a depressed affect. States she does feel a little better today. She is not tearful. She denies AVH/HI. She continues to endorse SI with plan to overdose. She can not contract for safety. She denies any symptoms of alcohol withdrawal. CIWA scores have remained 0.  On admission her UDS was positive for cocaine and THC, BAL <10 ? ?Stay Summary:  ? ?Patient has remained calm and cooperative while on the unit. Patient continues to endorse SI with a plan. She meets inpatient psychiatric admission criteria. [facility] notified and there is no bed availability. SW faxed patient out and she has been accepted to [facility]. She can arrive on 12/26/2021 after 10 am. Accepting physician is Dr. Jola Babinski.  ? ?Total Time spent with patient: 30 minutes ? ?Past Psychiatric History: see H&P ?Past  Medical History: No past medical history on file. No past surgical history on file. ?Family History: No family history on file. ?Family Psychiatric History: see H&P ?Social History:  ?Social History  ? ?Substance and Sexual Activity  ?Alcohol Use Yes  ? Comment: "every day i drink all of them"  ?   ?Social History  ? ?Substance and Sexual Activity  ?Drug Use Yes  ? Frequency: 2.0 times per week  ? Types: Cocaine  ?  ?Social History  ? ?Socioeconomic History  ? Marital status: Single  ?  Spouse name: Not on file  ? Number of children: Not on file  ? Years of education: Not on file  ? Highest education level: Not on file  ?Occupational History  ? Not on file  ?Tobacco Use  ? Smoking status: Never  ? Smokeless tobacco: Never  ?Substance and Sexual Activity  ? Alcohol use: Yes  ?  Comment: "every day i drink all of them"  ? Drug use: Yes  ?  Frequency: 2.0 times per week  ?  Types: Cocaine  ? Sexual activity: Not Currently  ?Other Topics Concern  ? Not on file  ?Social History Narrative  ? Not on file  ? ?Social Determinants of Health  ? ?Financial Resource Strain: Not on file  ?Food Insecurity: Not on file  ?Transportation Needs: Not on file  ?Physical Activity: Not on file  ?Stress: Not on file  ?Social Connections: Not on file  ? ?SDOH:  ?SDOH Screenings  ? ?Alcohol Screen: Not on file  ?Depression (PHQ2-9): Medium Risk  ? PHQ-2 Score:  15  ?Financial Resource Strain: Not on file  ?Food Insecurity: Not on file  ?Housing: Not on file  ?Physical Activity: Not on file  ?Social Connections: Not on file  ?Stress: Not on file  ?Tobacco Use: Low Risk   ? Smoking Tobacco Use: Never  ? Smokeless Tobacco Use: Never  ? Passive Exposure: Not on file  ?Transportation Needs: Not on file  ? ? ?Tobacco Cessation:  N/A, patient does not currently use tobacco products ? ?Current Medications:  ?Current Facility-Administered Medications  ?Medication Dose Route Frequency Provider Last Rate Last Admin  ? acetaminophen (TYLENOL) tablet 650  mg  650 mg Oral Q6H PRN Ardis Hughs, NP      ? alum & mag hydroxide-simeth (MAALOX/MYLANTA) 200-200-20 MG/5ML suspension 30 mL  30 mL Oral Q4H PRN Ardis Hughs, NP      ? hydrOXYzine (ATARAX) tablet 25 mg  25 mg Oral Q6H PRN Ardis Hughs, NP      ? loperamide (IMODIUM) capsule 2-4 mg  2-4 mg Oral PRN Ardis Hughs, NP      ? LORazepam (ATIVAN) tablet 1 mg  1 mg Oral Q6H PRN Ardis Hughs, NP      ? magnesium hydroxide (MILK OF MAGNESIA) suspension 30 mL  30 mL Oral Daily PRN Ardis Hughs, NP      ? multivitamin with minerals tablet 1 tablet  1 tablet Oral Daily Ardis Hughs, NP   1 tablet at 12/25/21 0934  ? ondansetron (ZOFRAN-ODT) disintegrating tablet 4 mg  4 mg Oral Q6H PRN Ardis Hughs, NP      ? thiamine tablet 100 mg  100 mg Oral Daily Ardis Hughs, NP   100 mg at 12/25/21 0737  ? traZODone (DESYREL) tablet 50 mg  50 mg Oral QHS PRN Ardis Hughs, NP      ? ?No current outpatient medications on file.  ? ? ?PTA Medications: (Not in a hospital admission) ? ? ?Musculoskeletal  ?Strength & Muscle Tone: within normal limits ?Gait & Station: normal ?Patient leans: N/A ? ?Psychiatric Specialty Exam  ?Presentation  ?General Appearance: Appropriate for Environment; Fairly Groomed ? ?Eye Contact:Good ? ?Speech:Clear and Coherent; Normal Rate ? ?Speech Volume:Normal ? ?Handedness:Right ? ? ?Mood and Affect  ?Mood:Anxious; Depressed ? ?Affect:Congruent ? ? ?Thought Process  ?Thought Processes:Coherent ? ?Descriptions of Associations:Intact ? ?Orientation:Full (Time, Place and Person) ? ?Thought Content:Logical ? Diagnosis of Schizophrenia or Schizoaffective disorder in past: No ?  ? Hallucinations:Hallucinations: None ? ?Ideas of Reference:None ? ?Suicidal Thoughts:Suicidal Thoughts: Yes, Active ?SI Active Intent and/or Plan: With Intent; With Plan; With Means to Carry Out ? ?Homicidal Thoughts:Homicidal Thoughts: No ? ? ?Sensorium  ?Memory:Immediate Good; Remote  Good; Recent Good ? ?Judgment:Poor ? ?Insight:Poor ? ? ?Executive Functions  ?Concentration:Good ? ?Attention Span:Good ? ?Recall:Good ? ?Fund of Knowledge:Good ? ?Language:Good ? ? ?Psychomotor Activity  ?Psychomotor Activity:Psychomotor Activity: Normal ? ? ?Assets  ?Assets:Communication Skills; Desire for Improvement; Financial Resources/Insurance; Physical Health; Resilience ? ? ?Sleep  ?Sleep:Sleep: Fair ?Number of Hours of Sleep: 6 ? ? ?Nutritional Assessment (For OBS and FBC admissions only) ?Has the patient had a weight loss or gain of 10 pounds or more in the last 3 months?: No ?Has the patient had a decrease in food intake/or appetite?: Yes ?Does the patient have dental problems?: No ?Does the patient have eating habits or behaviors that may be indicators of an eating disorder including binging or inducing vomiting?: No ?Has the patient recently  lost weight without trying?: 2.0 ?Has the patient been eating poorly because of a decreased appetite?: 1 ?Malnutrition Screening Tool Score: 3 ? ? ? ?Physical Exam  ?Physical Exam ?Vitals and nursing note reviewed.  ?Constitutional:   ?   General: She is not in acute distress. ?   Appearance: Normal appearance. She is not ill-appearing.  ?HENT:  ?   Head: Normocephalic.  ?Eyes:  ?   General:     ?   Right eye: No discharge.     ?   Left eye: No discharge.  ?   Conjunctiva/sclera: Conjunctivae normal.  ?Cardiovascular:  ?   Rate and Rhythm: Normal rate.  ?Pulmonary:  ?   Effort: Pulmonary effort is normal. No respiratory distress.  ?Musculoskeletal:     ?   General: Normal range of motion.  ?   Cervical back: Normal range of motion.  ?Skin: ?   Coloration: Skin is not jaundiced or pale.  ?Neurological:  ?   Mental Status: She is alert and oriented to person, place, and time.  ?Psychiatric:     ?   Attention and Perception: Attention and perception normal.     ?   Mood and Affect: Affect normal. Mood is anxious and depressed.     ?   Speech: Speech normal.     ?    Behavior: Behavior is cooperative.     ?   Thought Content: Thought content includes suicidal ideation. Thought content includes suicidal plan.     ?   Cognition and Memory: Cognition normal.     ?   J

## 2021-12-26 LAB — GC/CHLAMYDIA PROBE AMP (~~LOC~~) NOT AT ARMC
Chlamydia: NEGATIVE
Comment: NEGATIVE
Comment: NORMAL
Neisseria Gonorrhea: NEGATIVE

## 2021-12-26 NOTE — ED Notes (Signed)
Pt sleeping but easily awake to name being called. RN informed pt that he would be transferred to [facility] this am. Pt verbalize agreement and consent for transfer. Pt denies SI/HI/AVH. Pt states, "I don't feel safe going home right now. I don't trust myself  but I'm scared about going inpatient somewhere". Support and encouragement provided. Informed pt to notify staff with any needs or concerns. Safety maintained. ?

## 2021-12-26 NOTE — ED Notes (Signed)
[place] for nurse to nurse report but received VM and unable to leave message. Called back to admissions. RN was told that she would have nurse call me back to receive report. Awaiting report. ?

## 2021-12-26 NOTE — ED Notes (Signed)
Pt sleeping at present, no distress noted, respirations even & unlabored.  Monitoring for safety. ?

## 2021-12-26 NOTE — ED Notes (Signed)
Pt sleeping in no acute distress. RR even and unlabored. Safety maintained. 

## 2021-12-26 NOTE — ED Notes (Signed)
Patient has been giving breakfast. ?

## 2021-12-26 NOTE — ED Notes (Signed)
Pt transferred to [facility] Due to requiring higher level of care. Discharged in no acute distress. All belongings returned and given to safe transport driver from locker #26 to include a black bookbag with belongings, a cellphone, shoes. Safety maintained. ?

## 2021-12-26 NOTE — ED Notes (Signed)
Talked with provider, patient was calm and responsive. ?

## 2021-12-26 NOTE — ED Notes (Signed)
Pt sleeping at present, no distress noted.  Monitoring for safety. 

## 2021-12-26 NOTE — Progress Notes (Signed)
Received Janet Silva this AM asleep in her bed, she woke up on her own and received breakfast. Nurse Fayrene Fearing from [facility] called and report was given to her at 0921 hrs. She requested a copy of her MAR and discharge summary to to be sent with her. ?

## 2022-02-22 ENCOUNTER — Ambulatory Visit (INDEPENDENT_AMBULATORY_CARE_PROVIDER_SITE_OTHER): Payer: Medicaid Other | Admitting: Physician Assistant

## 2022-02-22 VITALS — BP 114/78 | HR 84 | Temp 98.0°F | Ht 67.0 in | Wt 109.0 lb

## 2022-02-22 DIAGNOSIS — F321 Major depressive disorder, single episode, moderate: Secondary | ICD-10-CM | POA: Diagnosis not present

## 2022-02-22 DIAGNOSIS — G479 Sleep disorder, unspecified: Secondary | ICD-10-CM | POA: Diagnosis not present

## 2022-02-22 DIAGNOSIS — R4689 Other symptoms and signs involving appearance and behavior: Secondary | ICD-10-CM | POA: Diagnosis not present

## 2022-02-22 MED ORDER — QUETIAPINE FUMARATE 25 MG PO TABS: 25.0000 mg | ORAL_TABLET | Freq: Every day | ORAL | 2 refills | Status: DC

## 2022-02-22 MED ORDER — SERTRALINE HCL 50 MG PO TABS: 50.0000 mg | ORAL_TABLET | Freq: Every day | ORAL | 2 refills | Status: DC

## 2022-02-22 NOTE — Progress Notes (Addendum)
Psychiatric Initial Adult Assessment   Patient Identification: Janet Silva MRN:  [id number] Date of Evaluation:  02/25/2022 Referral Source: Behavioral Health Urgent Care Chief Complaint:   Chief Complaint  Patient presents with   Medication Management   Visit Diagnosis:    ICD-10-CM   1. Current moderate episode of major depressive disorder, unspecified whether recurrent (HCC)  F32.1 sertraline (ZOLOFT) 50 MG tablet    2. Aggression  R46.89 QUEtiapine (SEROQUEL) 25 MG tablet    3. Sleep disturbances  G47.9       History of Present Illness:    Janet Silva is a 21 year old female with a past psychiatric history significant for depression and anger issues who presents to [facility] to establish psychiatric care and for medication management.  Patient reports that she was recently seen at [facility] due to worsening depression.  After being assessed at [facility], patient was accepted to [facility] for psychiatric admission.  Patient was admitted to [facility] on 12/26/2021 and stayed there for 5 to 6 days.  Patient was placed on Zoloft 50 mg daily and Seroquel 25 mg at bedtime following discharge from the hospital.  Patient reports that the medications have been helpful since taking them.  She reports that she was admitted to [facility] due to suicidal thoughts after getting into an argument with her girlfriend.  Patient states that she tried to take her life by overdosing on drugs.  Patient denies depression but does endorse anxiety she rates a 6 or 7 out of 10.  Patient reports that her anxiety is alleviated through breathing exercises.  Patient's current stressors include being unemployed and financial instability.  Patient states that she struggles with aggression and states that her main trigger is her mother.  She reports that her mother has an alcohol problem and that she gets under her skin.  Patient  also states that that yelling and loud noises irritate her.  Patient endorses past history of hospitalization due to mental health.  Patient endorses previously attempting suicide last year in August via drug overdose.  Patient denies a past history of self-harm.  A PHQ-9 screen was performed with the patient scoring 17.  A GAD-7 screen was also performed with the patient scoring a 16.  A [place] suicide severity rating scale was performed with the patient being considered high risk.  Patient denies being a danger to herself and is able to contract for safety following the conclusion of the encounter.  Patient is alert and oriented x4, calm, cooperative, and fully engaged in conversation during the encounter.  Patient denies suicidal or homicidal ideations.  She further denies auditory or visual hallucinations and does not appear to be responding to internal/external stimuli.  Patient endorses good sleep and receives on average 6 hours of sleep each night.  Patient states that her Seroquel helps with her ability to sleep.  Patient endorses good appetite and eats on average 2 meals per day.  Patient endorses moderate alcohol consumption and states that she drinks 5-6 shots every 3 days.  Patient endorses tobacco use and states that a pack of cigarettes lasts her a couple of days.  Patient endorses illicit drug use in the form of cocaine.  Associated Signs/Symptoms: Depression Symptoms:  depressed mood, anhedonia, insomnia, hypersomnia, psychomotor agitation, psychomotor retardation, fatigue, feelings of worthlessness/guilt, difficulty concentrating, hopelessness, impaired memory, suicidal thoughts with specific plan, anxiety, panic attacks, loss of energy/fatigue, disturbed sleep, increased appetite, (Hypo) Manic  Symptoms:  Distractibility, Elevated Mood, Flight of Ideas, Community education officer, Grandiosity, Irritable Mood, Labiality of Mood, Anxiety Symptoms:  Agoraphobia, Excessive  Worry, Panic Symptoms, Social Anxiety, Specific Phobias, Psychotic Symptoms:  Paranoia, PTSD Symptoms: Had a traumatic exposure:  Patient reports that her father had passed. Had a traumatic exposure in the last month:  N/A Re-experiencing:  Flashbacks Intrusive Thoughts Nightmares Hypervigilance:  Yes Hyperarousal:  Difficulty Concentrating Emotional Numbness/Detachment Increased Startle Response Irritability/Anger Sleep Avoidance:  Decreased Interest/Participation Foreshortened Future  Past Psychiatric History:  Anger issues Anxiety Depression  Previous Psychotropic Medications: Yes   Substance Abuse History in the last 12 months:  Yes.    Consequences of Substance Abuse: Medical Consequences:  None Legal Consequences:  None Family Consequences:  None Blackouts:  Patient endorses having blackouts in the past DT's: None Withdrawal Symptoms:   None  Past Medical History: No past medical history on file. No past surgical history on file.  Family Psychiatric History:  Patient denies a family history of psychiatric illness  Family History: No family history on file.  Social History:   Social History   Socioeconomic History   Marital status: Single    Spouse name: Not on file   Number of children: Not on file   Years of education: Not on file   Highest education level: Not on file  Occupational History   Not on file  Tobacco Use   Smoking status: Never   Smokeless tobacco: Never  Substance and Sexual Activity   Alcohol use: Yes    Comment: "every day i drink all of them"   Drug use: Yes    Frequency: 2.0 times per week    Types: Cocaine   Sexual activity: Not Currently  Other Topics Concern   Not on file  Social History Narrative   Not on file   Social Determinants of Health   Financial Resource Strain: Not on file  Food Insecurity: Not on file  Transportation Needs: Not on file  Physical Activity: Not on file  Stress: Not on file  Social  Connections: Not on file    Additional Social History:  Patient is currently unemployed but has aspirations to work in Architect.  Patient endorses social support.  Patient reports that her access to transportation varies but does endorse helping.  Allergies:  No Known Allergies  Metabolic Disorder Labs: Lab Results  Component Value Date   HGBA1C 4.8 12/24/2021   MPG 91.06 12/24/2021   No results found for: "PROLACTIN" Lab Results  Component Value Date   CHOL 161 12/24/2021   TRIG 61 12/24/2021   HDL 61 12/24/2021   CHOLHDL 2.6 12/24/2021   VLDL 12 12/24/2021   LDLCALC 88 12/24/2021   Lab Results  Component Value Date   TSH 1.080 12/24/2021    Therapeutic Level Labs: No results found for: "LITHIUM" No results found for: "CBMZ" No results found for: "VALPROATE"  Current Medications: Current Outpatient Medications  Medication Sig Dispense Refill   QUEtiapine (SEROQUEL) 25 MG tablet Take 1 tablet (25 mg total) by mouth at bedtime. 30 tablet 2   sertraline (ZOLOFT) 50 MG tablet Take 1 tablet (50 mg total) by mouth daily. 30 tablet 2   No current facility-administered medications for this visit.    Musculoskeletal: Strength & Muscle Tone: within normal limits Gait & Station: normal Patient leans: N/A  Psychiatric Specialty Exam: Review of Systems  Psychiatric/Behavioral:  Positive for sleep disturbance. Negative for decreased concentration, dysphoric mood, hallucinations, self-injury and suicidal ideas. The patient  is nervous/anxious. The patient is not hyperactive.     Blood pressure 114/78, pulse 84, temperature 98 F (36.7 C), height 5\' 7"  (1.702 m), weight 109 lb (49.4 kg), SpO2 100 %.Body mass index is 17.07 kg/m.  General Appearance: Casual  Eye Contact:  Good  Speech:  Clear and Coherent and Normal Rate  Volume:  Normal  Mood:  Anxious and Euthymic  Affect:  Appropriate and Congruent  Thought Process:  Coherent, Goal Directed, and Descriptions of  Associations: Intact  Orientation:  Full (Time, Place, and Person)  Thought Content:  WDL  Suicidal Thoughts:  No  Homicidal Thoughts:  No  Memory:  Immediate;   Good Recent;   Good Remote;   Good  Judgement:  Good  Insight:  Good  Psychomotor Activity:  Normal  Concentration:  Concentration: Good and Attention Span: Good  Recall:  Good  Fund of Knowledge:Good  Language: Good  Akathisia:  No  Handed:  Right  AIMS (if indicated):  not done  Assets:  Communication Skills Desire for Improvement Housing Social Support Transportation  ADL's:  Intact  Cognition: WNL  Sleep:  Fair   Screenings: GAD-7    [place] Office Visit from 02/22/2022 in [facility]  Total GAD-7 Score 16      PHQ2-9    [place] Office Visit from 02/22/2022 in [facility] ED from 05/12/2021 in [place] DEPT  PHQ-2 Total Score 3 5  PHQ-9 Total Score 17 15      [place] Office Visit from 02/22/2022 in [facility] ED from 12/24/2021 in [facility] ED from 05/12/2021 in [place] DEPT  C-SSRS RISK CATEGORY High Risk High Risk High Risk       Assessment and Plan:   Janet Silva is a 21 year old female with a past psychiatric history significant for depression and anger issues who presents to [facility] to establish psychiatric care and for medication management.  Patient was previously seen at [facility] and subsequently transferred over to [facility] due to worsening depression and suicidal ideations.  Patient was discharged from [facility] on the following medications: Seroquel 25 mg at bedtime and sertraline 50 mg daily.  Patient states that the medications have been helpful in managing her depressive symptoms, anxiety, and aggression.  Patient would like to continue  taking her medications as prescribed.  Patient's medications to be e-prescribed to pharmacy of choice.  Collaboration of Care: Medication Management AEB provider managing patient's psychiatric medications, Psychiatrist AEB patient being followed by mental health provider, and Referral or follow-up with counselor/therapist AEB patient being seen by a licensed clinical social worker at this facility  Patient/Guardian was advised Release of Information must be obtained prior to any record release in order to collaborate their care with an outside provider. Patient/Guardian was advised if they have not already done so to contact the registration department to sign all necessary forms in order for [place] to release information regarding their care.   Consent: Patient/Guardian gives verbal consent for treatment and assignment of benefits for services provided during this visit. Patient/Guardian expressed understanding and agreed to proceed.   1. Current moderate episode of major depressive disorder, unspecified whether recurrent (HCC)  - sertraline (ZOLOFT) 50 MG tablet; Take 1 tablet (50 mg total) by mouth daily.  Dispense: 30 tablet; Refill: 2  2. Aggression  - QUEtiapine (SEROQUEL) 25 MG  tablet; Take 1 tablet (25 mg total) by mouth at bedtime.  Dispense: 30 tablet; Refill: 2  3. Sleep disturbances  Patient to follow-up in 2 month Provider spent a total of 35 minutes with the patient/reviewing patient's chart  Malachy Mood, PA 6/10/202311:23 PM

## 2022-02-25 ENCOUNTER — Encounter (HOSPITAL_COMMUNITY): Payer: Self-pay | Admitting: Physician Assistant

## 2022-04-25 ENCOUNTER — Encounter (HOSPITAL_COMMUNITY): Payer: Self-pay | Admitting: Physician Assistant

## 2022-04-25 ENCOUNTER — Ambulatory Visit (INDEPENDENT_AMBULATORY_CARE_PROVIDER_SITE_OTHER): Payer: Medicaid Other | Admitting: Physician Assistant

## 2022-04-25 DIAGNOSIS — F321 Major depressive disorder, single episode, moderate: Secondary | ICD-10-CM

## 2022-04-25 DIAGNOSIS — R4689 Other symptoms and signs involving appearance and behavior: Secondary | ICD-10-CM | POA: Diagnosis not present

## 2022-04-26 ENCOUNTER — Encounter (HOSPITAL_COMMUNITY): Payer: Self-pay | Admitting: Physician Assistant
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
## 2022-05-05 ENCOUNTER — Ambulatory Visit (HOSPITAL_COMMUNITY): Payer: Self-pay | Admitting: Clinical

## 2022-06-24 ENCOUNTER — Other Ambulatory Visit (HOSPITAL_COMMUNITY): Payer: Self-pay | Admitting: Physician Assistant

## 2022-06-24 DIAGNOSIS — F321 Major depressive disorder, single episode, moderate: Secondary | ICD-10-CM

## 2022-06-27 ENCOUNTER — Encounter (HOSPITAL_COMMUNITY): Payer: Medicaid Other | Admitting: Physician Assistant

## 2022-06-30 ENCOUNTER — Telehealth (INDEPENDENT_AMBULATORY_CARE_PROVIDER_SITE_OTHER): Payer: Medicaid Other | Admitting: Psychiatry

## 2022-06-30 ENCOUNTER — Encounter (HOSPITAL_COMMUNITY): Payer: Self-pay | Admitting: Psychiatry

## 2022-06-30 DIAGNOSIS — R4689 Other symptoms and signs involving appearance and behavior: Secondary | ICD-10-CM

## 2022-06-30 DIAGNOSIS — F321 Major depressive disorder, single episode, moderate: Secondary | ICD-10-CM

## 2022-06-30 DIAGNOSIS — F329 Major depressive disorder, single episode, unspecified: Secondary | ICD-10-CM | POA: Insufficient documentation

## 2022-06-30 DIAGNOSIS — F1090 Alcohol use, unspecified, uncomplicated: Secondary | ICD-10-CM | POA: Diagnosis not present

## 2022-06-30 DIAGNOSIS — F1421 Cocaine dependence, in remission: Secondary | ICD-10-CM

## 2022-06-30 MED ORDER — QUETIAPINE FUMARATE 25 MG PO TABS: 25.0000 mg | ORAL_TABLET | Freq: Every evening | ORAL | 2 refills | Status: DC

## 2022-06-30 MED ORDER — SERTRALINE HCL 50 MG PO TABS: 50.0000 mg | ORAL_TABLET | Freq: Every day | ORAL | 2 refills | Status: DC

## 2022-06-30 NOTE — Patient Instructions (Signed)
Thank you for attending your appointment today.  -- Restart Zoloft 50 mg daily and Seroquel 25 mg at night -- Minimize alcohol use as this can impact mood, irritability, anxiety, and sleep -- Continue other medications as prescribed.  Please do not make any changes to medications without first discussing with your provider. If you are experiencing a psychiatric emergency, please call 911 or present to your nearest emergency department. Additional crisis, medication management, and therapy resources are included below.  [facility]  [address] [place], [place] [address] [phone] WALK-IN URGENT CARE 24/7 FOR ANYONE [address] [place], [place] Fax: [phone] [email or web address] *Interpreters available *Accepts all insurance and uninsured for Urgent Care needs *Accepts Medicaid and uninsured for outpatient treatment (below)      ONLY FOR [facility]  Below:    Outpatient New Patient Assessment/Therapy Walk-ins:        Monday -Thursday 8am until slots are full.        Every Friday 1pm-4pm  (first come, first served)                   New Patient Psychiatry/Medication Management        Monday-Friday 8am-11am (first come, first served)               For all walk-ins we ask that you arrive by 7:15am, because patients will be seen in the order of arrival.

## 2022-06-30 NOTE — Progress Notes (Signed)
[place] MD Outpatient Progress Note  06/30/2022 11:38 AM Janet Silva  MRN:  [id number]  Assessment:  Janet Silva presents for follow-up evaluation. Today, 06/30/22, patient reports overall stability of mood and denies any recent altercations which she attributes to below medications as well as no longer using cocaine. Reports ongoing significant alcohol use accompanied by using larger amounts that intended, difficulty controlling alcohol use, financial consequences, cravings and urges to use alcohol, and alcohol use leading to consequences for work consistent with alcohol use disorder. Used brief motivational interviewing to assess readiness for decreasing use - appears to be in contemplation phase. Will continue to engage in discussion around use and support reduction/cessation. Patient reports running out of medications last week and will plan to restart at same dosing as below.  Plan to RTC in 2 months.  Identifying Information: Janet Silva is a 21 y.o. female with a history of major depressive disorder, poor anger/impulse control, alcohol use disorder, and cocaine use who is an established patient with [place] participating in follow-up via video conferencing.   Plan:  # Major depressive disorder Past medication trials: unknown Status of problem: improving Interventions: -- RESTART Zoloft 50 mg daily (ran out 1 week ago)  # Anger/impulse control Past medication trials: unknown Status of problem: improving Interventions: -- RESTART Seroquel 25 mg nightly (ran out 1 week ago) -- Support substance cessation (see below) -- Patient has intake for individual psychotherapy on 07/12/22  # Alcohol use disorder Past medication trials: n/a Status of problem: chronic Interventions: -- Continue to monitor use and support reduction -- Therapy as above -- Introduced option of CDIOP; patient not interested at this time -- Consider medications for alcohol cravings/urges  as indicated  # Cocaine use disorder early remission Past medication trials: n/a Status of problem: early remission Interventions: -- Continue to monitor use and support ongoing cessation  # High risk medication use Interventions: -- Seroquel: lipid panel and A1c wnl (12/24/21)  Patient was given contact information for behavioral health clinic and was instructed to call 911 for emergencies.   Subjective:  Chief Complaint:  Chief Complaint  Patient presents with   Medication Management    Interval History:   Reports things have been "pretty good" although ran out of medication last week. Since that time, noticed she has less energy and motivation. Mood has remained stable. Denies feeling depressed or anxious. Does endorse episodes of irritability without medication and feels medications help her to remain calm. Last altercation was in August and notes she has had less altercations since being on medication. Sleep has been stable and feels rested - sleeping about 8 hours nightly. Appetite normal. Expresses interest in restarting medications - denies prior side effects including sedation, dizziness, or constipation.   Denies SI, HI, AVH.   Last used cocaine in April - reflects that cocaine use worsens irritability. Denies cravings or urges to use.   Drinks etoh use every few days - finishes a bottle of tequila or Hennessy every 2 days. States she has cut back somewhat as she was previously drinking this amount in a day. Brief MI used to engage patient in discussion around etoh use and desire to minimize use. She states she was prompted to cut back as she didn't want to feel reliant on it. She identifies signs of dependency include drinking increasing amounts, not being able to control use, and using etoh to self-medicate. States alcohol helped with mood at first but realized it made her feel more depressed  and angry, disrupted sleep and appetite. Feels etoh has has impacted work (currently  looking into warehouse jobs) but denies impacts on relationships. Endorses cravings and urges to drink; can resist when by herself but harder if she's around others who are drinking.  Longest period without etoh was 1 week in April when admitted to [facility]. Denies withdrawal symptoms  (tremor, hallucinations, seizures) during that time or in the past.   She identifies a goal to reduce but not stop drinking. Not sure what quantity she would find reasonable. Thinks keeping her days busy and structured would help. Introduced resources including CDIOP as well as considering medications for AUD. She declines at this time. Plans to start individual psychotherapy in 2 weeks.  Visit Diagnosis:    ICD-10-CM   1. Current moderate episode of major depressive disorder, unspecified whether recurrent (HCC)  F32.1 sertraline (ZOLOFT) 50 MG tablet    2. Aggression  R46.89 QUEtiapine (SEROQUEL) 25 MG tablet    3. Alcohol use disorder  F10.90     4. Cocaine use disorder, severe, in early remission (HCC)  F14.21       Past Psychiatric History:  Diagnoses: MDD, aggression, cocaine use, alcohol use Medication trials: unknown Hospitalizations: most recently [facility] 12/26/21-12/31/21 for worsening depression and SI in s/o argument with significant other Suicide attempts: yes - via overdose August 2022 Substance use:   -- Etoh: 1 bottle liquor every 2 days; denies history of withdrawal symptoms; denies legal consequences from use  -- Tobacco: 1 pack every 2-3 days  -- Cocaine: reports last use in April 2023  -- Denies illicit drug use  Past Medical History:  Past Medical History:  Diagnosis Date   COVID-19    Depression    History reviewed. No pertinent surgical history.  Family Psychiatric History:  Mother: alcohol abuse  Family History:  Family History  Problem Relation Age of Onset   Alcohol abuse Mother     Social History:  Social History   Socioeconomic History   Marital status: Single    Spouse  name: Not on file   Number of children: Not on file   Years of education: Not on file   Highest education level: Not on file  Occupational History   Not on file  Tobacco Use   Smoking status: Never   Smokeless tobacco: Never  Substance and Sexual Activity   Alcohol use: Yes    Comment: 1 bottle of liquor (tequila or Hennessy) every 2 days   Drug use: Not Currently    Types: Cocaine    Comment: Reports last use of cocaine in April 2023   Sexual activity: Not Currently  Other Topics Concern   Not on file  Social History Narrative   Not on file   Social Determinants of Health   Financial Resource Strain: Not on file  Food Insecurity: Not on file  Transportation Needs: Not on file  Physical Activity: Not on file  Stress: Not on file  Social Connections: Not on file    Allergies: No Known Allergies  Current Medications: Current Outpatient Medications  Medication Sig Dispense Refill   QUEtiapine (SEROQUEL) 25 MG tablet Take 1 tablet (25 mg total) by mouth at bedtime. 30 tablet 2   sertraline (ZOLOFT) 50 MG tablet Take 1 tablet (50 mg total) by mouth daily. 30 tablet 2   No current facility-administered medications for this visit.    ROS: Denies any physical complaints  Objective:  Psychiatric Specialty Exam: There were no vitals taken  for this visit.There is no height or weight on file to calculate BMI.  General Appearance: Casual and Fairly Groomed  Eye Contact:  Good  Speech:  Clear and Coherent and Normal Rate  Volume:  Normal  Mood:   "stable"  Affect:  Appropriate and Euthymic, Calm  Thought Content:  Denies AVH, IOR, paranoia    Suicidal Thoughts:  No  Homicidal Thoughts:  No  Thought Process:  Goal Directed and Linear  Orientation:  Full (Time, Place, and Person)    Memory:   Grossly intact  Judgment:  Impaired  Insight:  Fair  Concentration:  Concentration: Good  Recall:  NA  Fund of Knowledge: Good  Language: Good  Psychomotor Activity:  Normal   Akathisia:  No  AIMS (if indicated): not done  Assets:  Communication Skills Desire for Improvement Housing Intimacy Leisure Time Physical Health Social Support  ADL's:  Intact  Cognition: WNL  Sleep:  Good   PE: General: sits comfortably in view of camera; no acute distress  Pulm: no increased work of breathing on room air  MSK: all extremity movements appear intact  Neuro: no focal neurological deficits observed  Gait & Station: unable to assess by video    Metabolic Disorder Labs: Lab Results  Component Value Date   HGBA1C 4.8 12/24/2021   MPG 91.06 12/24/2021   No results found for: "PROLACTIN" Lab Results  Component Value Date   CHOL 161 12/24/2021   TRIG 61 12/24/2021   HDL 61 12/24/2021   CHOLHDL 2.6 12/24/2021   VLDL 12 12/24/2021   LDLCALC 88 12/24/2021   Lab Results  Component Value Date   TSH 1.080 12/24/2021    Therapeutic Level Labs: No results found for: "LITHIUM" No results found for: "VALPROATE" No results found for: "CBMZ"  Screenings:  GAD-7    Flowsheet Row Office Visit from 04/25/2022 in [facility] Office Visit from 02/22/2022 in [facility]  Total GAD-7 Score 10 16      PHQ2-9    Flowsheet Row Office Visit from 04/25/2022 in [facility] Office Visit from 02/22/2022 in [facility] ED from 05/12/2021 in [facility]  PHQ-2 Total Score 2 3 5   PHQ-9 Total Score 5 17 15       Flowsheet Row Office Visit from 04/25/2022 in [facility] Office Visit from 02/22/2022 in [facility] ED from 12/24/2021 in [facility]  C-SSRS RISK CATEGORY Error: Q6 is Yes, you must answer 7 High Risk High Risk       Collaboration of Care: Collaboration of Care: Medication Management AEB active medication management, Psychiatrist AEB  established with this provider, and Other provider involved in patient's care AEB patient to start individual psychotherapy  Patient/Guardian was advised Release of Information must be obtained prior to any record release in order to collaborate their care with an outside provider. Patient/Guardian was advised if they have not already done so to contact the registration department to sign all necessary forms in order for 02/23/2022 to release information regarding their care.   Consent: Patient/Guardian gives verbal consent for treatment and assignment of benefits for services provided during this visit. Patient/Guardian expressed understanding and agreed to proceed.   Televisit via video: I connected withNAME@ on 06/30/22 at 10:30 AM EDT by a video enabled telemedicine application and verified that I am speaking with the correct person using two identifiers.  Location: Patient: home address in [place] Provider: remote office in [place]   I discussed the limitations of evaluation and management by telemedicine and the availability of in person appointments. The patient expressed understanding and agreed to proceed.  I discussed the assessment and treatment plan with the patient. The patient was provided an opportunity to ask questions and all were answered. The patient agreed with the plan and demonstrated an understanding of the instructions.   The patient was advised to call back or seek an in-person evaluation if the symptoms worsen or if the condition fails to improve as anticipated.  I provided 40 minutes of non-face-to-face time during this encounter.  Somaly Marteney A  06/30/2022, 11:38 AM

## 2022-07-12 ENCOUNTER — Ambulatory Visit (INDEPENDENT_AMBULATORY_CARE_PROVIDER_SITE_OTHER): Payer: Medicaid Other | Admitting: Mental Health

## 2022-07-12 ENCOUNTER — Encounter (HOSPITAL_COMMUNITY): Payer: Self-pay

## 2022-07-12 DIAGNOSIS — F322 Major depressive disorder, single episode, severe without psychotic features: Secondary | ICD-10-CM | POA: Diagnosis not present

## 2022-07-12 DIAGNOSIS — F1090 Alcohol use, unspecified, uncomplicated: Secondary | ICD-10-CM

## 2022-07-12 NOTE — Plan of Care (Signed)
  Problem: Depression CCP Problem  1  Goal: LTG: Janet Silva WILL SCORE LESS THAN 10 ON THE PATIENT HEALTH QUESTIONNAIRE (PHQ-9) Outcome: Initial Goal: STG: Janet Silva will increase management of depression AEB development of x 3 effective emotional regulation skills within the next 90 days Outcome: Initial
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
## 2022-08-01 ENCOUNTER — Ambulatory Visit (HOSPITAL_COMMUNITY): Payer: Medicaid Other | Admitting: Licensed Clinical Social Worker

## 2022-08-02 ENCOUNTER — Ambulatory Visit (INDEPENDENT_AMBULATORY_CARE_PROVIDER_SITE_OTHER): Payer: Medicaid Other | Admitting: Licensed Clinical Social Worker

## 2022-08-02 DIAGNOSIS — F1090 Alcohol use, unspecified, uncomplicated: Secondary | ICD-10-CM | POA: Diagnosis not present

## 2022-08-04 ENCOUNTER — Ambulatory Visit (HOSPITAL_COMMUNITY): Payer: Medicaid Other

## 2022-08-04 NOTE — Progress Notes (Signed)
   THERAPIST PROGRESS NOTE  Session Time: 30 minutes  Participation Level: Active  Behavioral Response: Well GroomedAlertEuthymic  Type of Therapy: Individual Therapy  Treatment Goals addressed: Substance Use  ProgressTowards Goals: Initial  Interventions: Motivational Interviewing  Summary: Aanshi Batchelder is a 21 y.o. female who presents for individual session to discuss CD-IOP group attendance. Sam reports that she has past alcohol use, primarily. She reports that she last drank was one week ago. She reports that she would primarily drink liquor taking shots, usually about 6-7 at a time. Sam is open to beginning CD-IOP group treatment as she is a great candidate. She shows enthusiasm about beginning treatment.   Suicidal/Homicidal: Nowithout intent/plan  Therapist Response: Assesses substance use history, assessed for issues of dangerousness. Mental status evaluated.  Plan: Begin CD-IOP on 08/04/2022.  Diagnosis: Alcohol use disorder   Collaboration of Care: Medication Management AEB 08/25/2022  Patient/Guardian was advised Release of Information must be obtained prior to any record release in order to collaborate their care with an outside provider. Patient/Guardian was advised if they have not already done so to contact the registration department to sign all necessary forms in order for [place] to release information regarding their care.   Consent: Patient/Guardian gives verbal consent for treatment and assignment of benefits for services provided during this visit. Patient/Guardian expressed understanding and agreed to proceed.   Cheri Fowler, [facility] 08/04/2022

## 2022-08-24 NOTE — Progress Notes (Signed)
Patient did not connect for virtual psychiatric medication management appointment on 08/25/22 at 9:30AM. Called phone with no answer; left VM with callback number to reschedule.  Daine Gip, MD 08/25/22

## 2022-08-25 ENCOUNTER — Encounter (HOSPITAL_COMMUNITY): Payer: Self-pay

## 2022-08-25 ENCOUNTER — Encounter (HOSPITAL_COMMUNITY): Payer: Medicaid Other | Admitting: Psychiatry

## 2022-10-13 ENCOUNTER — Encounter (HOSPITAL_COMMUNITY): Payer: Self-pay | Admitting: Physician Assistant

## 2022-10-13 ENCOUNTER — Telehealth (INDEPENDENT_AMBULATORY_CARE_PROVIDER_SITE_OTHER): Payer: Medicaid Other | Admitting: Physician Assistant

## 2022-10-13 DIAGNOSIS — F321 Major depressive disorder, single episode, moderate: Secondary | ICD-10-CM | POA: Diagnosis not present

## 2022-10-13 DIAGNOSIS — R4689 Other symptoms and signs involving appearance and behavior: Secondary | ICD-10-CM

## 2022-10-13 MED ORDER — SERTRALINE HCL 50 MG PO TABS: 50.0000 mg | ORAL_TABLET | Freq: Every day | ORAL | 2 refills | Status: DC

## 2022-10-13 MED ORDER — QUETIAPINE FUMARATE 25 MG PO TABS: 25.0000 mg | ORAL_TABLET | Freq: Every evening | ORAL | 2 refills | Status: DC

## 2022-10-13 NOTE — Progress Notes (Signed)
BH MD/PA/NP OP Progress Note  Virtual Visit via Video Note  I connected with Janet Silva on 10/13/22 at  1:00 PM EST by a video enabled telemedicine application and verified that I am speaking with the correct person using two identifiers.  Location: Patient: Home Provider: Clinic   I discussed the limitations of evaluation and management by telemedicine and the availability of in person appointments. The patient expressed understanding and agreed to proceed.  Follow Up Instructions:  I discussed the assessment and treatment plan with the patient. The patient was provided an opportunity to ask questions and all were answered. The patient agreed with the plan and demonstrated an understanding of the instructions.   The patient was advised to call back or seek an in-person evaluation if the symptoms worsen or if the condition fails to improve as anticipated.  I provided 11 minutes of non-face-to-face time during this encounter.  Meta Hatchet, PA    10/13/2022 1:24 PM Alanya Vukelich  MRN:  [id number]  Chief Complaint:  Chief Complaint  Patient presents with   Medication Refill   Follow-up   HPI:   Janet Silva is a 22 year old, Asian female with a past psychiatric history significant for major depressive disorder and aggression who presented to [facility] via virtual video visit for follow-up and medication refill.  Patient was last seen by Daine Gip, MD on 06/30/2022.  During her last encounter, patient was being managed on the following psychiatric medications:  Seroquel 25 mg at bedtime Zoloft 50 mg daily  Patient reports that she is doing well and recently acquired a job at [employer].  Reports that she is taking her medications as prescribed; however, she reports that her period has stopped since taking her medications.  Patient is unsure of which medication is causing absence of her period.  She reports that her last  period was back in October.  Patient denies any other adverse side effects from her current medication regimen.  Patient endorses good mood and denies having any thoughts of wanting to harm herself.  Patient states that on occasion she experiences depressive episodes where she feels groggy and sad but those symptoms go away relatively quickly.  Patient endorses depressive episodes 2 days out of the week.  She denies anxiety and further denies any new stressors at this time.  A GAD-7 screen was performed with the patient going to 4.  Patient is alert and oriented x 4, calm, cooperative, and fully engaged in conversation during the encounter.  Patient endorses good mood.  Patient denies suicidal or homicidal ideation.  She further denies auditory or visual hallucinations and does not appear to be responding to internal/external stimuli.  Patient endorses good sleep and receives on average 6 to 7 hours of sleep each night.  Patient endorses good appetite and eats on average 3 meals per day.  Patient endorses alcohol consumption sparingly.  Patient endorses tobacco use and smokes on average 4 cigarettes/day.  Patient denies illicit drug use.  Visit Diagnosis:    ICD-10-CM   1. Current moderate episode of major depressive disorder, unspecified whether recurrent (HCC)  F32.1 sertraline (ZOLOFT) 50 MG tablet    2. Aggression  R46.89 QUEtiapine (SEROQUEL) 25 MG tablet      Past Psychiatric History:  Diagnoses: MDD, aggression, cocaine use, alcohol use Medication trials: unknown Hospitalizations: most recently [facility] 12/26/21-12/31/21 for worsening depression and SI in s/o argument with significant other Suicide attempts: yes - via overdose  August 2022  Past Medical History:  Past Medical History:  Diagnosis Date   COVID-19    Depression    History reviewed. No pertinent surgical history.  Family Psychiatric History:  Mother - Alcohol abuse  Family History:  Family History  Problem Relation Age of  Onset   Alcohol abuse Mother     Social History:  Social History   Socioeconomic History   Marital status: Single    Spouse name: Not on file   Number of children: Not on file   Years of education: Not on file   Highest education level: Not on file  Occupational History   Not on file  Tobacco Use   Smoking status: Never   Smokeless tobacco: Never  Substance and Sexual Activity   Alcohol use: Yes    Comment: 1 bottle of liquor (tequila or Hennessy) every 2 days   Drug use: Not Currently    Types: Cocaine    Comment: Reports last use of cocaine in April 2023   Sexual activity: Not Currently  Other Topics Concern   Not on file  Social History Narrative   Not on file   Social Determinants of Health   Financial Resource Strain: High Risk (07/12/2022)   Overall Financial Resource Strain (CARDIA)    Difficulty of Paying Living Expenses: Very hard  Food Insecurity: No Food Insecurity (07/12/2022)   Hunger Vital Sign    Worried About Running Out of Food in the Last Year: Never true    [employer] in the Last Year: Never true  Transportation Needs: Unmet Transportation Needs (07/12/2022)   PRAPARE - Transportation    Lack of Transportation (Medical): Yes    Lack of Transportation (Non-Medical): Yes  Physical Activity: Inactive (07/12/2022)   Exercise Vital Sign    Days of Exercise per Week: 0 days    Minutes of Exercise per Session: 0 min  Stress: No Stress Concern Present (07/12/2022)   [place]    Feeling of Stress : Only a little  Social Connections: Socially Isolated (07/12/2022)   Social Connection and Isolation Panel [NHANES]    Frequency of Communication with Friends and Family: More than three times a week    Frequency of Social Gatherings with Friends and Family: Once a week    Attends Religious Services: Never    Marine scientist or Organizations: No    Attends Arts administrator: Never    Marital Status: Never married    Allergies: No Known Allergies  Metabolic Disorder Labs: Lab Results  Component Value Date   HGBA1C 4.8 12/24/2021   MPG 91.06 12/24/2021   No results found for: "PROLACTIN" Lab Results  Component Value Date   CHOL 161 12/24/2021   TRIG 61 12/24/2021   HDL 61 12/24/2021   CHOLHDL 2.6 12/24/2021   VLDL 12 12/24/2021   Annabella 88 12/24/2021   Lab Results  Component Value Date   TSH 1.080 12/24/2021    Therapeutic Level Labs: No results found for: "LITHIUM" No results found for: "VALPROATE" No results found for: "CBMZ"  Current Medications: Current Outpatient Medications  Medication Sig Dispense Refill   QUEtiapine (SEROQUEL) 25 MG tablet Take 1 tablet (25 mg total) by mouth at bedtime. 30 tablet 2   sertraline (ZOLOFT) 50 MG tablet Take 1 tablet (50 mg total) by mouth daily. 30 tablet 2   No current facility-administered medications for this visit.  Musculoskeletal: Strength & Muscle Tone: within normal limits Gait & Station: normal Patient leans: N/A  Psychiatric Specialty Exam: Review of Systems  Psychiatric/Behavioral:  Negative for decreased concentration, dysphoric mood, hallucinations, self-injury, sleep disturbance and suicidal ideas. The patient is not nervous/anxious and is not hyperactive.     There were no vitals taken for this visit.There is no height or weight on file to calculate BMI.  General Appearance: Casual  Eye Contact:  Good  Speech:  Clear and Coherent and Normal Rate  Volume:  Normal  Mood:  Euthymic  Affect:  Appropriate  Thought Process:  Coherent and Descriptions of Associations: Intact  Orientation:  Full (Time, Place, and Person)  Thought Content: WDL   Suicidal Thoughts:  No  Homicidal Thoughts:  No  Memory:  Immediate;   Good Recent;   Good Remote;   Good  Judgement:  Good  Insight:  Good  Psychomotor Activity:  Normal  Concentration:  Concentration: Good and  Attention Span: Good  Recall:  Good  Fund of Knowledge: Good  Language: Good  Akathisia:  No  Handed:  Right  AIMS (if indicated): not done  Assets:  Communication Skills Desire for Improvement Housing Social Support Transportation  ADL's:  Intact  Cognition: WNL  Sleep:  Good   Screenings: AUDIT    Health and safety inspector from 07/12/2022 in [facility]  Alcohol Use Disorder Identification Test Final Score (AUDIT) 26      GAD-7    Flowsheet Row Video Visit from 10/13/2022 in [facility] Counselor from 07/12/2022 in [facility] Office Visit from 04/25/2022 in [facility] Office Visit from 02/22/2022 in [facility]  Total GAD-7 Score 4 8 10 16       PHQ2-9    Flowsheet Row Video Visit from 10/13/2022 in [facility] Counselor from 07/12/2022 in [facility] Office Visit from 04/25/2022 in [facility] Office Visit from 02/22/2022 in [facility] ED from 05/12/2021 in [facility] Department at [facility]  PHQ-2 Total Score 0 1 2 3 5   PHQ-9 Total Score -- 7 5 17 15       Flowsheet Row Video Visit from 10/13/2022 in [facility] Counselor from 07/12/2022 in [facility] Office Visit from 04/25/2022 in [place] Moderate Risk Moderate Risk Error: Q6 is Yes, you must answer 7        Assessment and Plan:   Lashun "Sam" Schweigert is a 22 year old, Asian female with a past psychiatric history significant for major depressive disorder and aggression who presented to [facility] via virtual video visit for follow-up and medication refill.  Patient reports that she has been compliant  with her medications and states that they have been effective in managing her depression and anxiety.  Although patient endorses depressive episodes, she states that her depressive episodes do not last the whole day.  In regards to her medication use, patient reports that her periods have stopped since taking her medications.  She reports that her last period was back in October.  Patient denies any other adverse side effects from her current medication regimen.  Provider recommended that patient set herself up with a primary care provider now that she has Medicare.  Provider to look into what medication could be causing  patient's lack of period.  Patient is agreeable to continuing to take her medications as prescribed.  Patient's medication to be e-prescribed to pharmacy of choice.  Collaboration of Care: Collaboration of Care: Medication Management AEB provider managing patient's psychiatric medications and Psychiatrist AEB patient being followed by mental health provider  Patient/Guardian was advised Release of Information must be obtained prior to any record release in order to collaborate their care with an outside provider. Patient/Guardian was advised if they have not already done so to contact the registration department to sign all necessary forms in order for [place] to release information regarding their care.   Consent: Patient/Guardian gives verbal consent for treatment and assignment of benefits for services provided during this visit. Patient/Guardian expressed understanding and agreed to proceed.   1. Current moderate episode of major depressive disorder, unspecified whether recurrent (HCC)  - sertraline (ZOLOFT) 50 MG tablet; Take 1 tablet (50 mg total) by mouth daily.  Dispense: 30 tablet; Refill: 2  2. Aggression  - QUEtiapine (SEROQUEL) 25 MG tablet; Take 1 tablet (25 mg total) by mouth at bedtime.  Dispense: 30 tablet; Refill: 2  Patient to follow-up in 2 months Provider spent a  total of 11 minutes with the patient/reviewing patient's chart  Meta Hatchet, PA 10/13/2022, 1:24 PM

## 2022-10-20 ENCOUNTER — Ambulatory Visit: Payer: Medicaid Other

## 2022-10-20 ENCOUNTER — Ambulatory Visit
Admission: RE | Admit: 2022-10-20 | Discharge: 2022-10-20 | Disposition: A | Discharge status: Home / Self Care | Payer: Self-pay | Source: Ambulatory Visit | Attending: Urgent Care | Admitting: Urgent Care

## 2022-10-20 VITALS — BP 121/74 | HR 74 | Temp 98.3°F | Resp 16

## 2022-10-20 DIAGNOSIS — N912 Amenorrhea, unspecified: Secondary | ICD-10-CM

## 2022-10-20 DIAGNOSIS — Z3201 Encounter for pregnancy test, result positive: Secondary | ICD-10-CM

## 2022-10-20 LAB — POCT URINE PREGNANCY: Preg Test, Ur: POSITIVE — AB

## 2022-10-20 NOTE — Discharge Instructions (Addendum)
Please contact the Women's healthcare center at [facility] to discuss your options for your positive pregnancy test.

## 2022-10-20 NOTE — ED Triage Notes (Addendum)
Pt concerned she has not had a menstrual cycle since Oct 2023-states her psychiatrist advised her she could have irreg pds with new med- NAD-steady gait

## 2022-10-20 NOTE — ED Provider Notes (Signed)
[facility]  Note:  This document was prepared using Systems analyst and may include unintentional dictation errors.  MRN: [id number] DOB: Jan 03, 2001  Subjective:   Janet Silva is a 22 y.o. female presenting for amenorrhea. LMP was some time at the end of September 2023.  She has been seen by psychiatrist who has had her on Seroquel and started her on sertraline for management of depression.  She was advised to come to our clinic and make sure she was evaluated for her amenorrhea.  Denies fever, n/v, abdominal pain, pelvic pain, rashes, dysuria, urinary frequency, hematuria, vaginal discharge.    No current facility-administered medications for this encounter.  Current Outpatient Medications:    QUEtiapine (SEROQUEL) 25 MG tablet, Take 1 tablet (25 mg total) by mouth at bedtime., Disp: 30 tablet, Rfl: 2   sertraline (ZOLOFT) 50 MG tablet, Take 1 tablet (50 mg total) by mouth daily., Disp: 30 tablet, Rfl: 2   No Known Allergies  Past Medical History:  Diagnosis Date   COVID-19    Depression      History reviewed. No pertinent surgical history.  Family History  Problem Relation Age of Onset   Alcohol abuse Mother     Social History   Tobacco Use   Smoking status: Never   Smokeless tobacco: Never  Substance Use Topics   Alcohol use: Yes    Comment: 1 bottle of liquor (tequila or Hennessy) every 2 days   Drug use: Not Currently    Types: Cocaine    Comment: Reports last use of cocaine in April 2023    ROS   Objective:   Vitals: BP 121/74 (BP Location: Right Arm)   Pulse 74   Temp 98.3 F (36.8 C) (Oral)   Resp 16   SpO2 99%   Physical Exam Constitutional:      General: She is not in acute distress.    Appearance: Normal appearance. She is well-developed. She is not ill-appearing, toxic-appearing or diaphoretic.  HENT:     Head: Normocephalic and atraumatic.     Nose: Nose normal.     Mouth/Throat:     Mouth:  Mucous membranes are moist.  Eyes:     General: No scleral icterus.       Right eye: No discharge.        Left eye: No discharge.     Extraocular Movements: Extraocular movements intact.  Cardiovascular:     Rate and Rhythm: Normal rate.  Pulmonary:     Effort: Pulmonary effort is normal.  Skin:    General: Skin is warm and dry.  Neurological:     General: No focal deficit present.     Mental Status: She is alert and oriented to person, place, and time.  Psychiatric:        Mood and Affect: Mood normal.        Behavior: Behavior normal.     Results for orders placed or performed during the hospital encounter of 10/20/22 (from the past 24 hour(s))  POCT urine pregnancy     Status: Abnormal   Collection Time: 10/20/22 11:23 AM  Result Value Ref Range   Preg Test, Ur Positive (A) Negative    Assessment and Plan :   PDMP not reviewed this encounter.  1. Amenorrhea   2. Positive pregnancy test     Patient reports that this is an unplanned pregnancy, does not want to be pregnant.  I emphasized need to follow-up with  the women's health care center at [facility] urgently.  She is otherwise asymptomatic.   Jaynee Eagles, [place] 10/20/22 1133

## 2022-11-08 ENCOUNTER — Ambulatory Visit (HOSPITAL_COMMUNITY): Payer: Self-pay | Admitting: Mental Health

## 2022-12-08 ENCOUNTER — Telehealth (INDEPENDENT_AMBULATORY_CARE_PROVIDER_SITE_OTHER): Payer: Self-pay | Admitting: Physician Assistant

## 2022-12-08 ENCOUNTER — Encounter (HOSPITAL_COMMUNITY): Payer: Self-pay | Admitting: Physician Assistant

## 2022-12-08 DIAGNOSIS — F321 Major depressive disorder, single episode, moderate: Secondary | ICD-10-CM

## 2022-12-08 DIAGNOSIS — R4689 Other symptoms and signs involving appearance and behavior: Secondary | ICD-10-CM

## 2022-12-08 MED ORDER — QUETIAPINE FUMARATE 25 MG PO TABS: 25.0000 mg | ORAL_TABLET | Freq: Every evening | ORAL | 2 refills | Status: DC

## 2022-12-08 MED ORDER — SERTRALINE HCL 50 MG PO TABS: 50.0000 mg | ORAL_TABLET | Freq: Every day | ORAL | 2 refills | Status: DC

## 2022-12-08 NOTE — Progress Notes (Signed)
BH MD/PA/NP OP Progress Note  Virtual Visit via Video Note  I connected with Janet Silva on 12/08/22 at  1:30 PM EDT by a video enabled telemedicine application and verified that I am speaking with the correct person using two identifiers.  Location: Patient: Home Provider: Clinic   I discussed the limitations of evaluation and management by telemedicine and the availability of in person appointments. The patient expressed understanding and agreed to proceed.  Follow Up Instructions:  I discussed the assessment and treatment plan with the patient. The patient was provided an opportunity to ask questions and all were answered. The patient agreed with the plan and demonstrated an understanding of the instructions.   The patient was advised to call back or seek an in-person evaluation if the symptoms worsen or if the condition fails to improve as anticipated.  I provided 9 minutes of non-face-to-face time during this encounter.  Malachy Mood, PA    12/08/2022 4:09 PM Shanece Hambric  MRN:  [id number]  Chief Complaint:  Chief Complaint  Patient presents with   Follow-up   Medication Refill   HPI:   Janet Silva is a 22 year old, Asian female with a past psychiatric history significant for major depressive disorder and aggression who presented to [facility] via virtual video visit for follow-up and medication refill.  Patient is currently taking the following psychiatric medications:  Seroquel 25 mg at bedtime Zoloft 50 mg daily  Patient reports no issues or concerns regarding her current medication regimen.  Patient denies experiencing any adverse side effects at this time.  Patient denies the need for dosage adjustments at this time and is requesting refills on her medications following the conclusion of the encounter.  Patient reports that her mood has been good and denies depression at this time.  Patient denies anxiety and further  denies any new stressors currently.  Patient appears stable on her current medication regimen.  A PHQ-9 screen was performed with the patient scoring a 6.  A GAD-7 screen was also performed with the patient scoring a 4.  Patient is alert and oriented x 4, calm, cooperative, and fully engaged in conversation during the encounter.  Patient endorses good mood.  Patient denies suicidal or homicidal ideations.  She further denies auditory or visual hallucinations and does not appear to be responding to internal/external stimuli.  Patient endorses good sleep and receives on average 6 to 7 hours of sleep each night.  Patient endorses good appetite and eats on average 2-3 meals per day.  Patient endorses alcohol consumption on occasion.  Patient endorses tobacco use and smokes on average a pack every 3 days.  Patient denies illicit drug use.  Visit Diagnosis:    ICD-10-CM   1. Current moderate episode of major depressive disorder, unspecified whether recurrent (HCC)  F32.1 sertraline (ZOLOFT) 50 MG tablet    2. Aggression  R46.89 QUEtiapine (SEROQUEL) 25 MG tablet      Past Psychiatric History:  Diagnoses: MDD, aggression, cocaine use, alcohol use Medication trials: unknown Hospitalizations: most recently [facility] 12/26/21-12/31/21 for worsening depression and SI in s/o argument with significant other Suicide attempts: yes - via overdose August 2022  Past Medical History:  Past Medical History:  Diagnosis Date   COVID-19    Depression    History reviewed. No pertinent surgical history.  Family Psychiatric History:  Mother - Alcohol abuse  Family History:  Family History  Problem Relation Age of Onset   Alcohol abuse Mother  Social History:  Social History   Socioeconomic History   Marital status: Single    Spouse name: Not on file   Number of children: Not on file   Years of education: Not on file   Highest education level: Not on file  Occupational History   Not on file  Tobacco Use    Smoking status: Every Day    Types: Cigarettes   Smokeless tobacco: Never  Vaping Use   Vaping Use: Every day  Substance and Sexual Activity   Alcohol use: Yes    Comment: weekly   Drug use: Not Currently    Types: Cocaine   Sexual activity: Yes    Birth control/protection: None  Other Topics Concern   Not on file  Social History Narrative   Not on file   Social Determinants of Health   Financial Resource Strain: High Risk (07/12/2022)   Overall Financial Resource Strain (CARDIA)    Difficulty of Paying Living Expenses: Very hard  Food Insecurity: No Food Insecurity (07/12/2022)   Hunger Vital Sign    Worried About Running Out of Food in the Last Year: Never true    Ran Out of Food in the Last Year: Never true  Transportation Needs: Unmet Transportation Needs (07/12/2022)   PRAPARE - Transportation    Lack of Transportation (Medical): Yes    Lack of Transportation (Non-Medical): Yes  Physical Activity: Inactive (07/12/2022)   Exercise Vital Sign    Days of Exercise per Week: 0 days    Minutes of Exercise per Session: 0 min  Stress: No Stress Concern Present (07/12/2022)   [place]    Feeling of Stress : Only a little  Social Connections: Socially Isolated (07/12/2022)   Social Connection and Isolation Panel [NHANES]    Frequency of Communication with Friends and Family: More than three times a week    Frequency of Social Gatherings with Friends and Family: Once a week    Attends Religious Services: Never    Marine scientist or Organizations: No    Attends Music therapist: Never    Marital Status: Never married    Allergies: No Known Allergies  Metabolic Disorder Labs: Lab Results  Component Value Date   HGBA1C 4.8 12/24/2021   MPG 91.06 12/24/2021   No results found for: "PROLACTIN" Lab Results  Component Value Date   CHOL 161 12/24/2021   TRIG 61 12/24/2021   HDL 61  12/24/2021   CHOLHDL 2.6 12/24/2021   VLDL 12 12/24/2021   [place] 88 12/24/2021   Lab Results  Component Value Date   TSH 1.080 12/24/2021    Therapeutic Level Labs: No results found for: "LITHIUM" No results found for: "VALPROATE" No results found for: "CBMZ"  Current Medications: Current Outpatient Medications  Medication Sig Dispense Refill   QUEtiapine (SEROQUEL) 25 MG tablet Take 1 tablet (25 mg total) by mouth at bedtime. 30 tablet 2   sertraline (ZOLOFT) 50 MG tablet Take 1 tablet (50 mg total) by mouth daily. 30 tablet 2   No current facility-administered medications for this visit.     Musculoskeletal: Strength & Muscle Tone: within normal limits Gait & Station: normal Patient leans: N/A  Psychiatric Specialty Exam: Review of Systems  Psychiatric/Behavioral:  Negative for decreased concentration, dysphoric mood, hallucinations, self-injury, sleep disturbance and suicidal ideas. The patient is not nervous/anxious and is not hyperactive.     There were no vitals taken for this visit.There is  no height or weight on file to calculate BMI.  General Appearance: Casual  Eye Contact:  Good  Speech:  Clear and Coherent and Normal Rate  Volume:  Normal  Mood:  Euthymic  Affect:  Appropriate  Thought Process:  Coherent and Descriptions of Associations: Intact  Orientation:  Full (Time, Place, and Person)  Thought Content: WDL   Suicidal Thoughts:  No  Homicidal Thoughts:  No  Memory:  Immediate;   Good Recent;   Good Remote;   Good  Judgement:  Good  Insight:  Good  Psychomotor Activity:  Normal  Concentration:  Concentration: Good and Attention Span: Good  Recall:  Good  Fund of Knowledge: Good  Language: Good  Akathisia:  No  Handed:  Right  AIMS (if indicated): not done  Assets:  Communication Skills Desire for Improvement Housing Social Support Transportation  ADL's:  Intact  Cognition: WNL  Sleep:  Good   Screenings: AUDIT    Forensic scientist from 07/12/2022 in [facility]  Alcohol Use Disorder Identification Test Final Score (AUDIT) 26      GAD-7    Flowsheet Row Video Visit from 12/08/2022 in [facility] Video Visit from 10/13/2022 in [facility] Counselor from 07/12/2022 in [facility] Office Visit from 04/25/2022 in [facility] Office Visit from 02/22/2022 in [facility]  Total GAD-7 Score 4 4 8 10 16       PHQ2-9    Flowsheet Row Video Visit from 12/08/2022 in [facility] Video Visit from 10/13/2022 in [facility] Counselor from 07/12/2022 in [facility] Office Visit from 04/25/2022 in [facility] Office Visit from 02/22/2022 in [place]  PHQ-2 Total Score 2 0 1 2 3   PHQ-9 Total Score 6 -- 7 5 17       Flowsheet Row Video Visit from 12/08/2022 in [facility] ED from 10/20/2022 in [facility] at [facility]) Video Visit from 10/13/2022 in [place] No Risk No Risk Moderate Risk        Assessment and Plan:   Fergie "Sam" Chatten is a 22 year old, Asian female with a past psychiatric history significant for major depressive disorder and aggression who presented to [facility] via virtual video visit for follow-up and medication refill.  Patient reports that she has been compliant with her medications and states that they have been effective in managing her depression and anxiety.  Patient reports no issues or concerns with her current medication regimen.  Patient denies the need for dosage adjustments at this time and is requesting refills on her medications following  the conclusion of the encounter.  Patient denies depression or anxiety and further denies any new stressors at this time.  Patient to continue taking medications as prescribed.  Patient's medications to be e-prescribed to pharmacy of choice.  Collaboration of Care: Collaboration of Care: Medication Management AEB provider managing patient's psychiatric medications and Psychiatrist AEB patient being followed by mental health provider  Patient/Guardian was advised Release of Information must be obtained prior to any record release in order to collaborate their care with an outside provider. Patient/Guardian was advised if they have not already done so to contact the registration department to sign all necessary forms in order for  [place] to release information regarding their care.   Consent: Patient/Guardian gives verbal consent for treatment and assignment of benefits for services provided during this visit. Patient/Guardian expressed understanding and agreed to proceed.   1. Current moderate episode of major depressive disorder, unspecified whether recurrent (HCC)  - sertraline (ZOLOFT) 50 MG tablet; Take 1 tablet (50 mg total) by mouth daily.  Dispense: 30 tablet; Refill: 2  2. Aggression  - QUEtiapine (SEROQUEL) 25 MG tablet; Take 1 tablet (25 mg total) by mouth at bedtime.  Dispense: 30 tablet; Refill: 2   Patient to follow-up in 2 months Provider spent a total of 9 minutes with the patient/reviewing patient's chart  Malachy Mood, PA 12/08/2022, 4:09 PM

## 2022-12-12 ENCOUNTER — Ambulatory Visit (HOSPITAL_COMMUNITY): Payer: Self-pay

## 2022-12-14 ENCOUNTER — Ambulatory Visit: Payer: Self-pay

## 2022-12-15 ENCOUNTER — Inpatient Hospital Stay: Admission: RE | Admit: 2022-12-15 | Payer: Self-pay | Source: Ambulatory Visit

## 2022-12-28 IMAGING — DX DG CHEST 2V
2 series · 2 of 2 positions shown · non-contrast
Comparison: None.

CLINICAL DATA: Left-sided chest pain

EXAM:
CHEST - 2 VIEW

[chest pa]
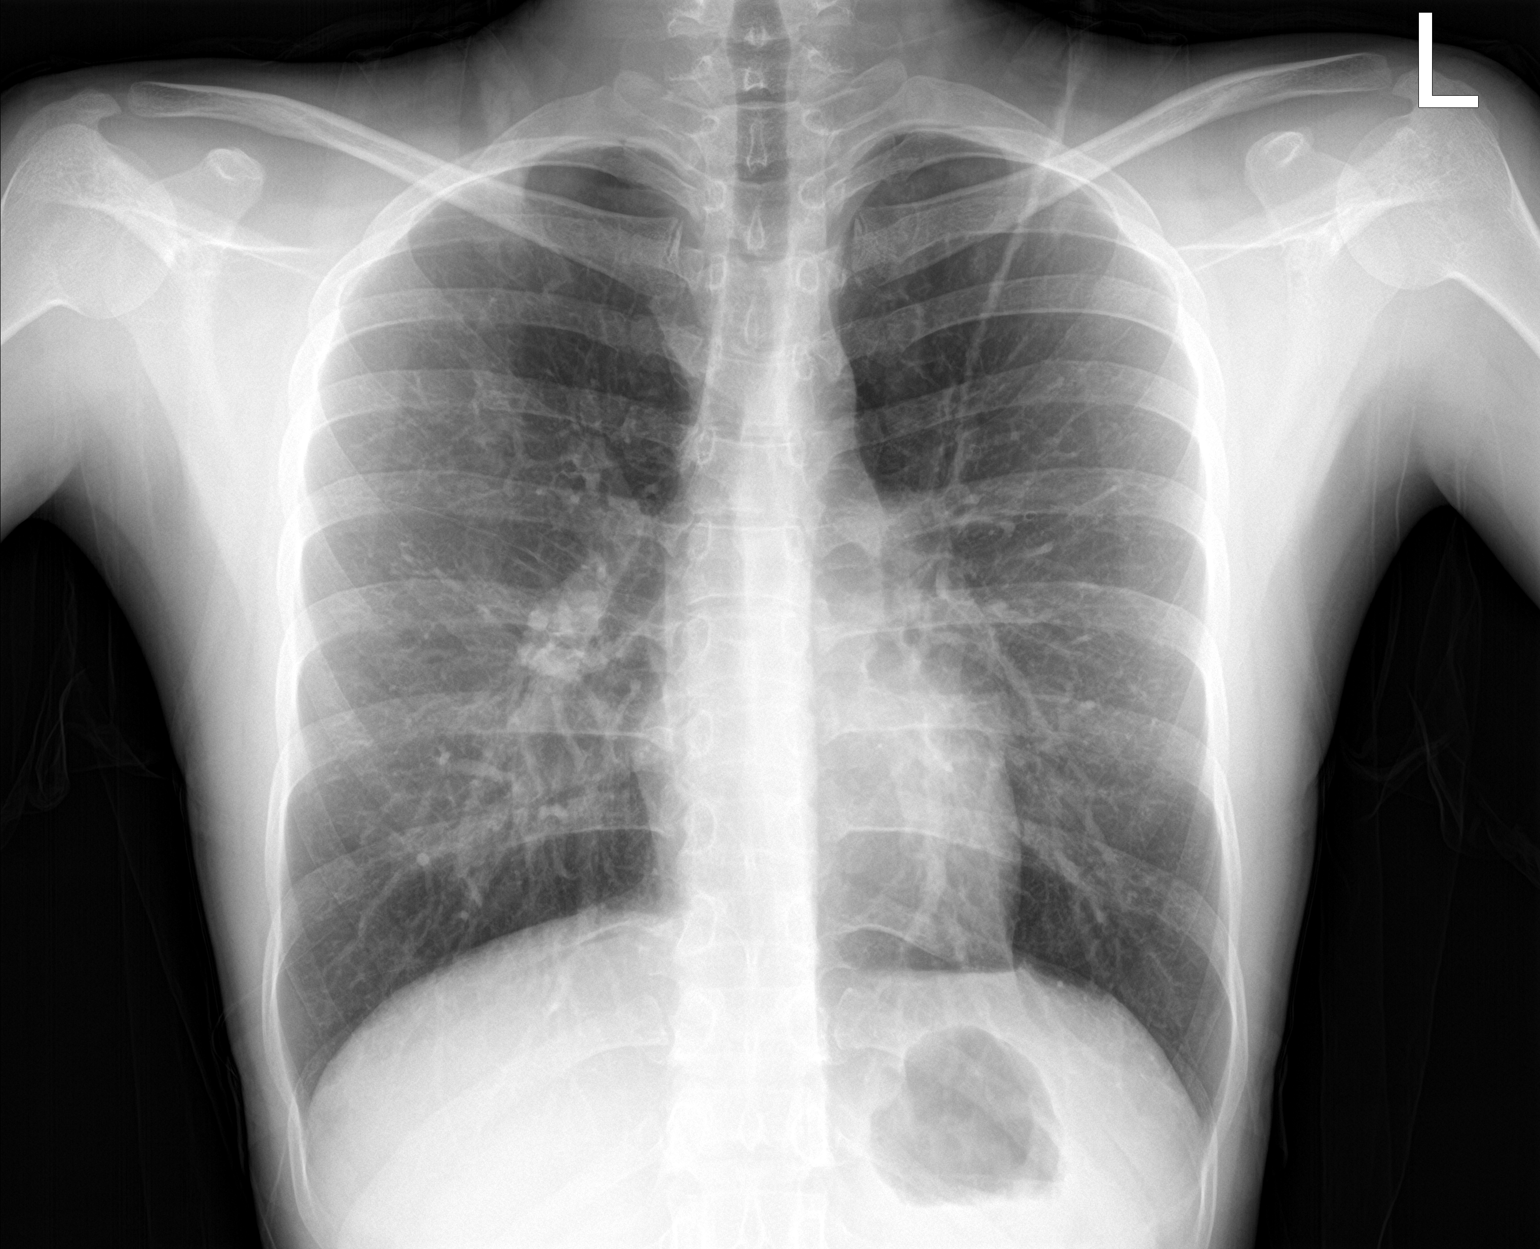

[chest lat]
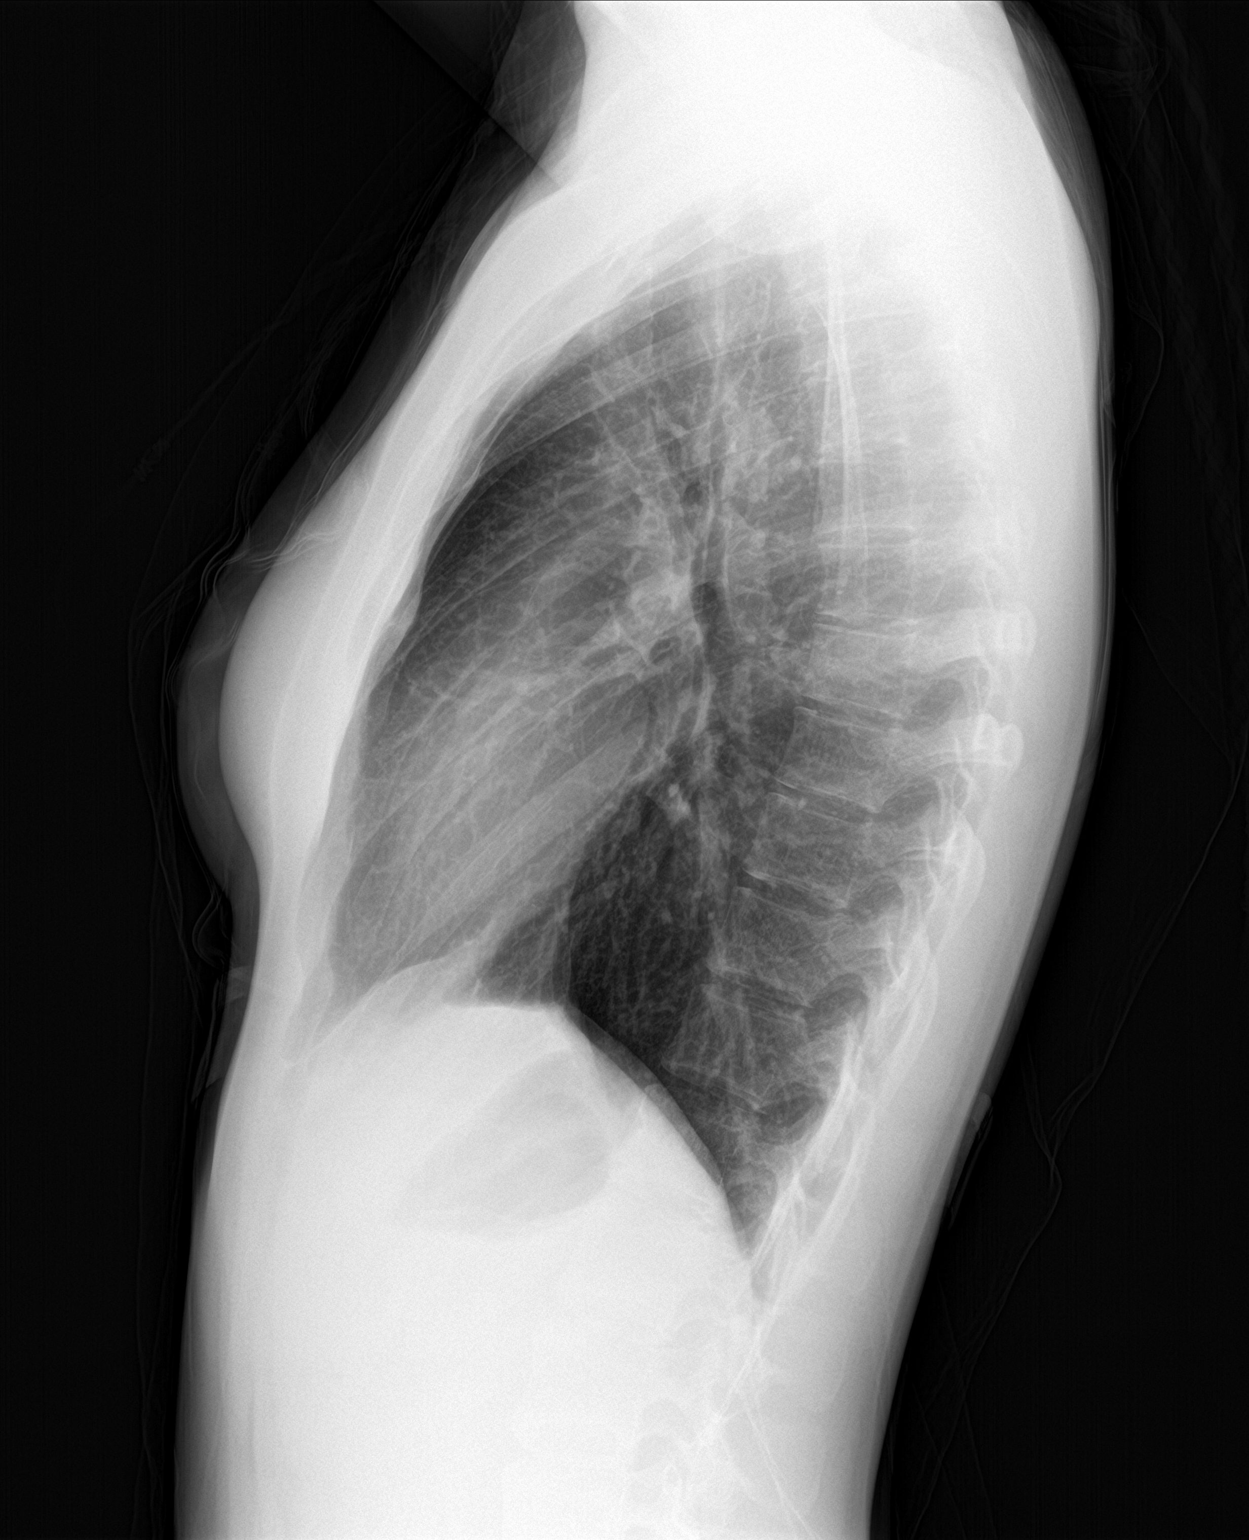

[2 of 2 positions shown; findings below may reference images not displayed]

FINDINGS: The heart size and mediastinal contours are within normal limits.
Both lungs are clear. The visualized skeletal structures are
unremarkable.
IMPRESSION: No active cardiopulmonary disease.

## 2023-02-02 IMAGING — DX DG HAND COMPLETE 3+V*R*
3 series · 3 of 3 positions shown · non-contrast
Comparison: None.

CLINICAL DATA: Hand pain common no known injury, initial encounter

EXAM:
RIGHT HAND - COMPLETE 3+ VIEW

[hand ap]
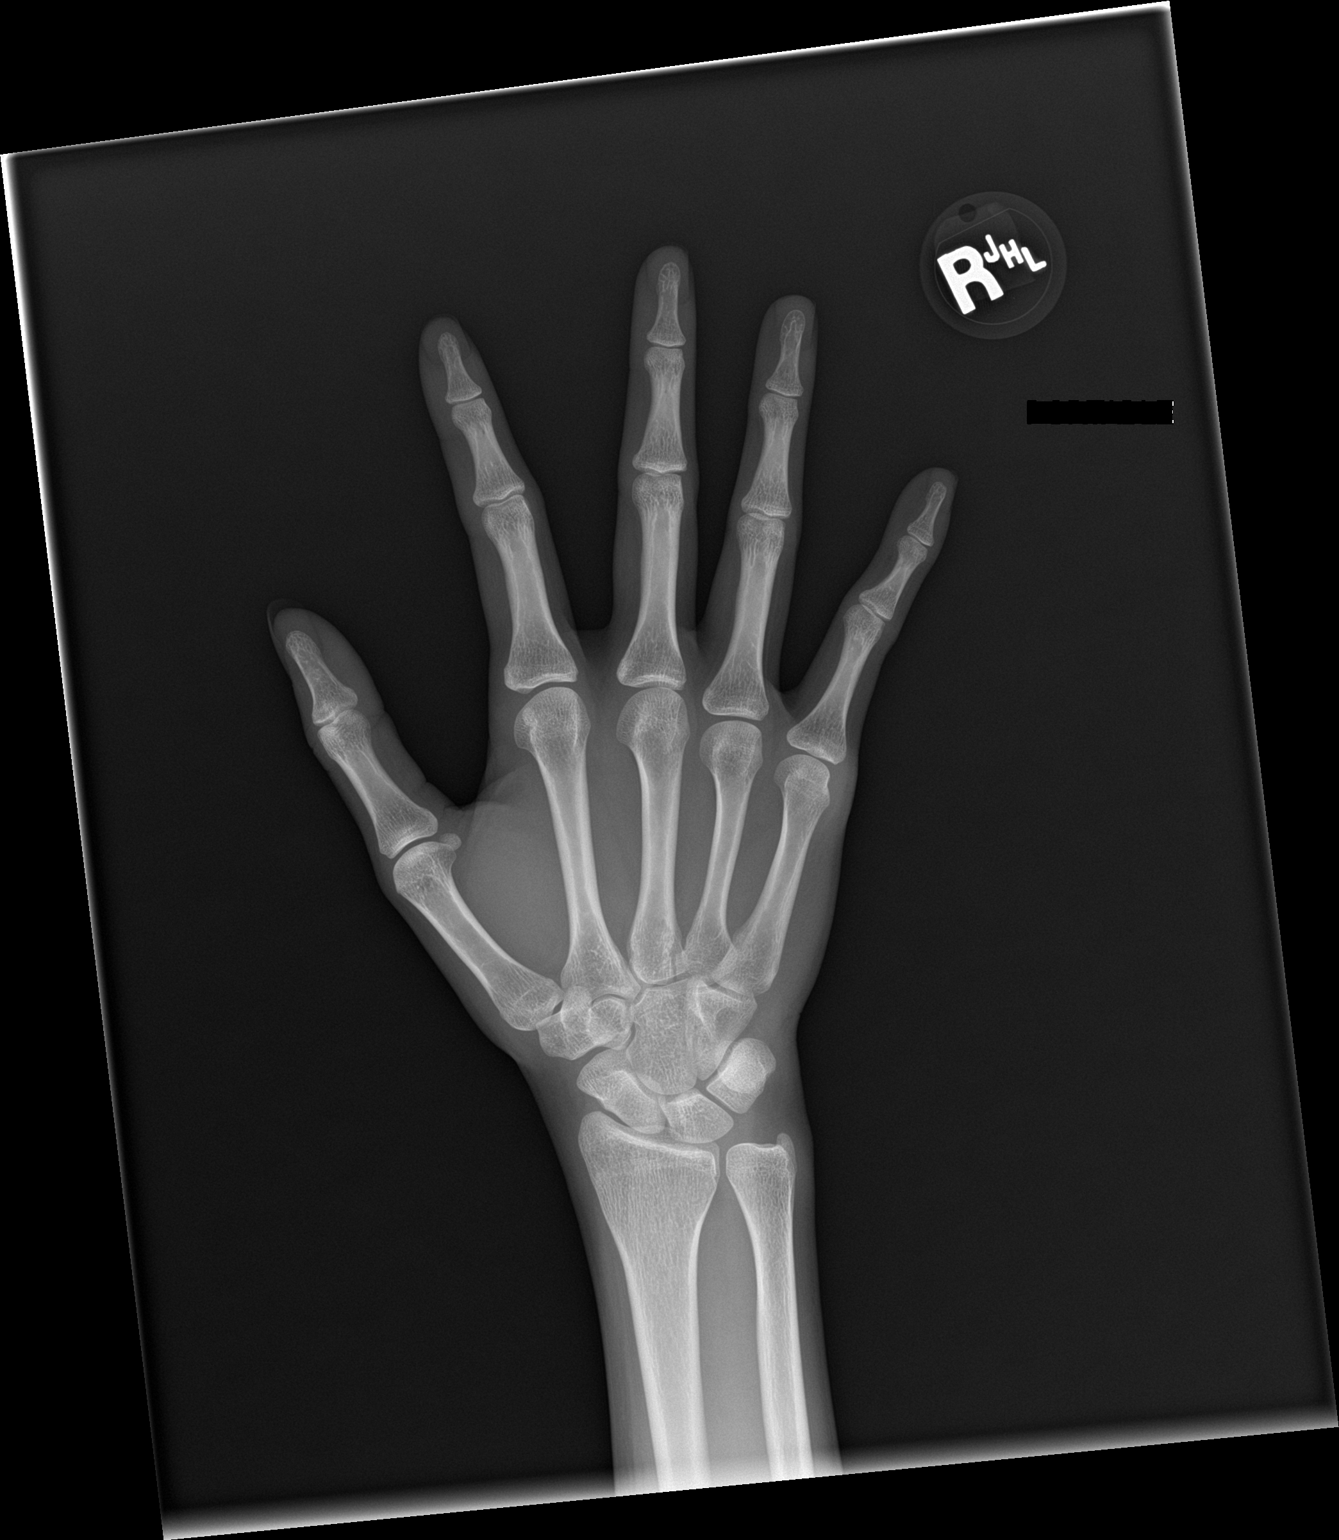

[hand obl]
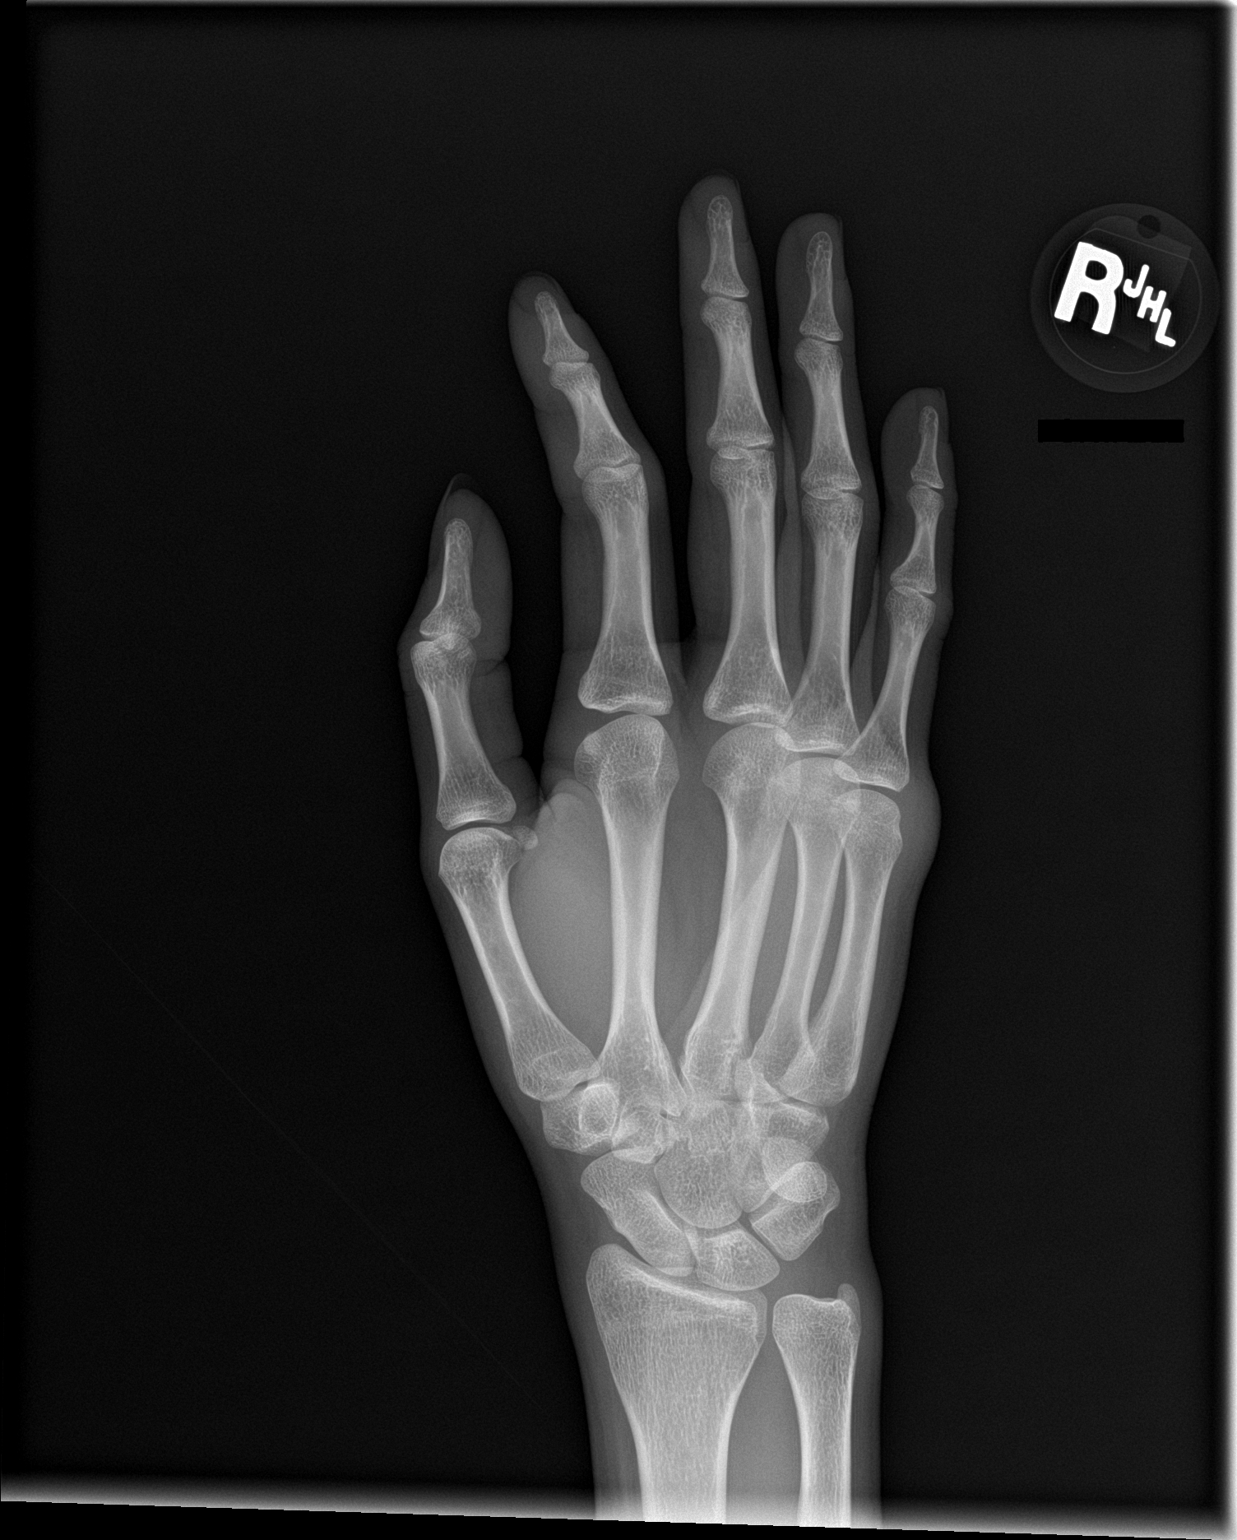

[hand lat]
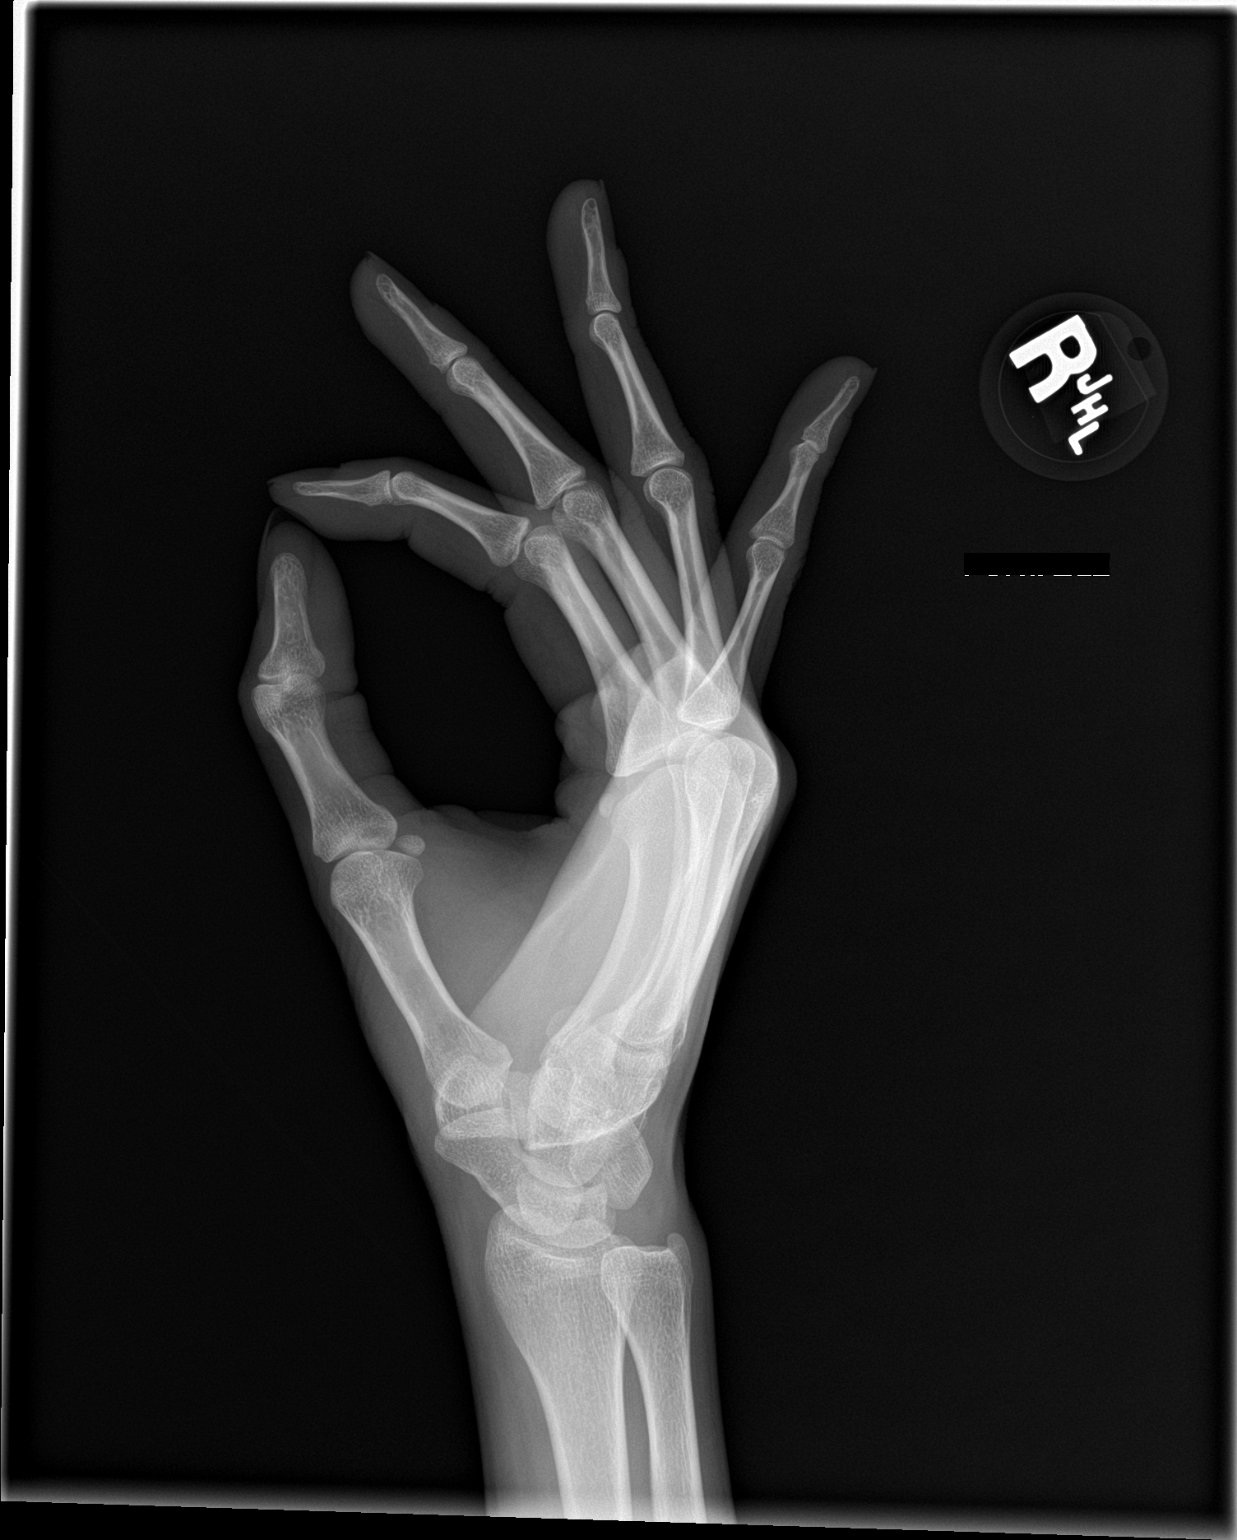

[3 of 3 positions shown; findings below may reference images not displayed]

FINDINGS: There is no evidence of fracture or dislocation. There is no
evidence of arthropathy or other focal bone abnormality. Soft
tissues are unremarkable.
IMPRESSION: No acute abnormality noted.

## 2023-02-16 ENCOUNTER — Other Ambulatory Visit: Payer: Self-pay

## 2023-02-16 ENCOUNTER — Telehealth (INDEPENDENT_AMBULATORY_CARE_PROVIDER_SITE_OTHER): Payer: No Payment, Other | Admitting: Physician Assistant

## 2023-02-16 ENCOUNTER — Other Ambulatory Visit (HOSPITAL_COMMUNITY): Payer: Self-pay

## 2023-02-16 DIAGNOSIS — R4689 Other symptoms and signs involving appearance and behavior: Secondary | ICD-10-CM

## 2023-02-16 DIAGNOSIS — F321 Major depressive disorder, single episode, moderate: Secondary | ICD-10-CM | POA: Diagnosis not present

## 2023-02-16 MED ORDER — SERTRALINE HCL 50 MG PO TABS
50.0000 mg | ORAL_TABLET | Freq: Every day | ORAL | 2 refills | Status: DC
  Filled 2023-02-16: qty 30, 30d supply, fill #0
  Filled 2023-03-26: qty 30, 30d supply, fill #1
  Filled 2023-04-26: qty 30, 30d supply, fill #2

## 2023-02-16 MED ORDER — QUETIAPINE FUMARATE 25 MG PO TABS
25.0000 mg | ORAL_TABLET | Freq: Every evening | ORAL | 2 refills | Status: DC
  Filled 2023-02-16: qty 30, 30d supply, fill #0
  Filled 2023-03-26: qty 30, 30d supply, fill #1
  Filled 2023-04-26: qty 30, 30d supply, fill #2

## 2023-02-17 ENCOUNTER — Ambulatory Visit
Admission: EM | Admit: 2023-02-17 | Discharge: 2023-02-17 | Disposition: A | Discharge status: Home / Self Care | Payer: Self-pay | Attending: Nurse Practitioner | Admitting: Nurse Practitioner

## 2023-02-17 DIAGNOSIS — R21 Rash and other nonspecific skin eruption: Secondary | ICD-10-CM

## 2023-02-17 MED ORDER — HYDROXYZINE HCL 25 MG PO TABS: 25.0000 mg | ORAL_TABLET | Freq: Three times a day (TID) | ORAL | 0 refills | Status: DC | PRN

## 2023-02-17 NOTE — Discharge Instructions (Signed)
May take hydroxyzine 3 times a day as needed for your intermittent rash.  Please of this medication can make you drowsy.  Do not drive or drink alcohol while on this medication Please follow-up with an allergist of the above and contact information for further workup of your symptoms Please go to the ER for any worsening symptoms

## 2023-02-17 NOTE — ED Provider Notes (Signed)
[facility]    CSN: [id number] Arrival date & time: 02/17/23  1300      History   Chief Complaint No chief complaint on file.   HPI Janet Silva is a 22 y.o. female presents for evaluation of an intermittent rash that is been going on for couple of months.  Patient reports 2 to 3 months of an intermittent pruritic red bumpy rash that appears without known cause and will resolve either on its own over a few minutes to hours or if she takes Benadryl.  Typically presents on her arms chest and sometimes face.  Does state it is very itchy.  Today she felt like her face and her lips were swelling.  She did take Benadryl and states this has completely resolved.  Denies any tongue/throat swelling, shortness of breath or breathing difficulty or difficulty swallowing.  No history of eczema or psoriasis.  Denies any history of allergies.  No new contacts including medications, soaps, pets, etc.  She does not have a PCP.  No other concerns at this time.  HPI  Past Medical History:  Diagnosis Date   COVID-19    Depression     Patient Active Problem List   Diagnosis Date Noted   Severe major depression, single episode (HCC) 07/12/2022   Alcohol use disorder 06/30/2022   Current moderate episode of major depressive disorder (HCC) 06/30/2022   Suicide attempt by drug overdose (HCC) 05/14/2021   Cocaine use disorder, severe, in early remission (HCC) 05/14/2021    History reviewed. No pertinent surgical history.  OB History   No obstetric history on file.      Home Medications    Prior to Admission medications   Medication Sig Start Date End Date Taking? Authorizing Provider  hydrOXYzine (ATARAX) 25 MG tablet Take 1 tablet (25 mg total) by mouth every 8 (eight) hours as needed for itching. 02/17/23  Yes Radford Pax, NP  QUEtiapine (SEROQUEL) 25 MG tablet Take 1 tablet (25 mg total) by mouth at bedtime. 02/16/23   Nwoko, Tommas Olp, PA  sertraline (ZOLOFT) 50 MG tablet Take 1  tablet (50 mg total) by mouth daily. 02/16/23   Meta Hatchet, PA    Family History Family History  Problem Relation Age of Onset   Alcohol abuse Mother     Social History Social History   Tobacco Use   Smoking status: Every Day    Packs/day: .5    Types: Cigarettes   Smokeless tobacco: Never  Vaping Use   Vaping Use: Some days  Substance Use Topics   Alcohol use: Yes    Comment: weekly   Drug use: Not Currently    Types: Cocaine     Allergies   Patient has no known allergies.   Review of Systems Review of Systems  Skin:  Positive for rash.     Physical Exam Triage Vital Signs ED Triage Vitals [02/17/23 1402]  Enc Vitals Group     BP 113/76     Pulse Rate 83     Resp 16     Temp 98.8 F (37.1 C)     Temp Source Oral     SpO2 99 %     Weight      Height      Head Circumference      Peak Flow      Pain Score      Pain Loc      Pain Edu?      Excl.  in GC?    No data found.  Updated Vital Signs BP 113/76 (BP Location: Left Arm)   Pulse 83   Temp 98.8 F (37.1 C) (Oral)   Resp 16   LMP 02/16/2023 (Exact Date)   SpO2 99%   Visual Acuity Right Eye Distance:   Left Eye Distance:   Bilateral Distance:    Right Eye Near:   Left Eye Near:    Bilateral Near:     Physical Exam Vitals and nursing note reviewed.  Constitutional:      General: She is not in acute distress.    Appearance: Normal appearance. She is not ill-appearing, toxic-appearing or diaphoretic.  HENT:     Head: Normocephalic and atraumatic.     Mouth/Throat:     Lips: Pink.     Mouth: Mucous membranes are moist.     Pharynx: Oropharynx is clear. Uvula midline. No pharyngeal swelling or uvula swelling.     Comments: There is no swelling of the tongue/lip/throat.  Airway is patent with midline uvula.  Patient speaking in complete sentences without issue Eyes:     Pupils: Pupils are equal, round, and reactive to light.  Cardiovascular:     Rate and Rhythm: Normal rate and  regular rhythm.     Heart sounds: Normal heart sounds.  Pulmonary:     Effort: Pulmonary effort is normal. No respiratory distress.     Breath sounds: Normal breath sounds. No stridor. No wheezing or rhonchi.  Musculoskeletal:     Cervical back: Normal range of motion and neck supple.  Skin:    General: Skin is warm and dry.     Comments: Faint scattered mildly erythematous papular rash on right forearm, left upper arm, and chest.  No rash on face.  No facial swelling.  Neurological:     General: No focal deficit present.     Mental Status: She is alert and oriented to person, place, and time.  Psychiatric:        Mood and Affect: Mood normal.        Behavior: Behavior normal.      UC Treatments / Results  Labs (all labs ordered are listed, but only abnormal results are displayed) Labs Reviewed - No data to display  EKG   Radiology No results found.  Procedures Procedures (including critical care time)  Medications Ordered in UC Medications - No data to display  Initial Impression / Assessment and Plan / UC Course  I have reviewed the triage vital signs and the nursing notes.  Pertinent labs & imaging results that were available during my care of the patient were reviewed by me and considered in my medical decision making (see chart for details).     Reviewed exam and symptoms with patient.  Unclear cause of his intermittent random rash that she is experiencing over the past couple of months.  Of note chart review shows she had a positive pregnancy test in February of this year.  Discussed with patient and she states she terminated the pregnancy and is not pregnant. Will do trial of hydroxyzine.  Side effect profile reviewed Referred to allergy specialist for workup of her symptoms Patient declined to be established with a PCP Strict ER precautions reviewed and patient verbalized understanding Final Clinical Impressions(s) / UC Diagnoses   Final diagnoses:  Rash  and nonspecific skin eruption     Discharge Instructions      May take hydroxyzine 3 times a day as needed for your  intermittent rash.  Please of this medication can make you drowsy.  Do not drive or drink alcohol while on this medication Please follow-up with an allergist of the above and contact information for further workup of your symptoms Please go to the ER for any worsening symptoms    ED Prescriptions     Medication Sig Dispense Auth. Provider   hydrOXYzine (ATARAX) 25 MG tablet Take 1 tablet (25 mg total) by mouth every 8 (eight) hours as needed for itching. 12 tablet Radford Pax, NP      PDMP not reviewed this encounter.   Radford Pax, NP 02/17/23 [phone]

## 2023-02-17 NOTE — ED Triage Notes (Signed)
Pt presents to UC w/ c/o facial swelling, itching last night. Pt took benadryl today. Pt states it's been happening for the past few months where she brakes out in hives. Denies new detergent, perfumes, or creams.

## 2023-02-19 ENCOUNTER — Encounter (HOSPITAL_COMMUNITY): Payer: Self-pay | Admitting: Physician Assistant

## 2023-02-19 NOTE — Progress Notes (Signed)
BH MD/PA/NP OP Progress Note  Virtual Visit via Video Note  I connected with Janet Silva on 02/19/23 at  1:30 PM EDT by a video enabled telemedicine application and verified that I am speaking with the correct person using two identifiers.  Location: Patient: Home Provider: Clinic   I discussed the limitations of evaluation and management by telemedicine and the availability of in person appointments. The patient expressed understanding and agreed to proceed.  Follow Up Instructions:  I discussed the assessment and treatment plan with the patient. The patient was provided an opportunity to ask questions and all were answered. The patient agreed with the plan and demonstrated an understanding of the instructions.   The patient was advised to call back or seek an in-person evaluation if the symptoms worsen or if the condition fails to improve as anticipated.  I provided 14 minutes of non-face-to-face time during this encounter.  Meta Hatchet, PA    02/19/2023 9:21 AM Sinea Carchi  MRN:  [id number]  Chief Complaint:  Chief Complaint  Patient presents with   Follow-up   Medication Refill   HPI:   Janet Silva is a 22 year old, Asian female with a past psychiatric history significant for major depressive disorder and aggression who presented to [facility] via virtual video visit for follow-up and medication refill.  Patient is currently taking the following psychiatric medications:  Seroquel 25 mg at bedtime Zoloft 50 mg daily  Patient reports that she has been doing all right; however, he states that he has not been able to receive her medications due to losing her Medicaid.  Despite not being able to take her medications regularly, patient reports that she still has been able to go to work.  Patient reports that she last took her medications consistently during her last encounter with this provider.  She reports that she had to see  you pills left over from her last prescriptions that she takes for emergency.  Patient endorses depression and rates her depression a 4 or 5 out of 10.  Patient states that she experiences depressive episodes 1 to 2 days out of the week with symptoms lasting half a day.  Patient endorses the following depressive symptoms: feelings of sadness, mood swings, and being quick to anger.  Patient endorses some anxiety as well.  A PHQ-9 screen was performed with the patient scoring an 8.  A GAD-7 screen was also performed the patient scoring a 9.  Patient is alert and oriented x 4, calm, cooperative, and fully engaged in conversation during the encounter.  Patient endorses good mood.  Patient denies suicidal or homicidal ideations.  She further denies auditory or visual hallucinations and does not appear to be responding to internal fresh external stimuli.  Patient endorses good sleep and receives on average 6 hours of sleep per night.  Patient endorses fair appetite and eats on average 1-2 meals per day.  Patient endorses alcohol consumption on occasion.  Patient endorses tobacco use and smokes on average a pack every 3 days.  Patient denies illicit drug use.  Visit Diagnosis:    ICD-10-CM   1. Current moderate episode of major depressive disorder, unspecified whether recurrent (HCC)  F32.1 sertraline (ZOLOFT) 50 MG tablet    2. Aggression  R46.89 QUEtiapine (SEROQUEL) 25 MG tablet      Past Psychiatric History:  Diagnoses: MDD, aggression, cocaine use, alcohol use Medication trials: unknown Hospitalizations: most recently [facility] 12/26/21-12/31/21 for worsening depression and SI in  s/o argument with significant other Suicide attempts: yes - via overdose August 2022  Past Medical History:  Past Medical History:  Diagnosis Date   COVID-19    Depression    History reviewed. No pertinent surgical history.  Family Psychiatric History:  Mother - Alcohol abuse  Family History:  Family History  Problem  Relation Age of Onset   Alcohol abuse Mother     Social History:  Social History   Socioeconomic History   Marital status: Single    Spouse name: Not on file   Number of children: Not on file   Years of education: Not on file   Highest education level: Not on file  Occupational History   Not on file  Tobacco Use   Smoking status: Every Day    Packs/day: .5    Types: Cigarettes   Smokeless tobacco: Never  Vaping Use   Vaping Use: Some days  Substance and Sexual Activity   Alcohol use: Yes    Comment: weekly   Drug use: Not Currently    Types: Cocaine   Sexual activity: Yes    Birth control/protection: None  Other Topics Concern   Not on file  Social History Narrative   Not on file   Social Determinants of Health   Financial Resource Strain: High Risk (07/12/2022)   Overall Financial Resource Strain (CARDIA)    Difficulty of Paying Living Expenses: Very hard  Food Insecurity: No Food Insecurity (07/12/2022)   Hunger Vital Sign    Worried About Running Out of Food in the Last Year: Never true    Ran Out of Food in the Last Year: Never true  Transportation Needs: Unmet Transportation Needs (07/12/2022)   PRAPARE - Transportation    Lack of Transportation (Medical): Yes    Lack of Transportation (Non-Medical): Yes  Physical Activity: Inactive (07/12/2022)   Exercise Vital Sign    Days of Exercise per Week: 0 days    Minutes of Exercise per Session: 0 min  Stress: No Stress Concern Present (07/12/2022)   [name]    Feeling of Stress : Only a little  Social Connections: Socially Isolated (07/12/2022)   Social Connection and Isolation Panel [NHANES]    Frequency of Communication with Friends and Family: More than three times a week    Frequency of Social Gatherings with Friends and Family: Once a week    Attends Religious Services: Never    Database administrator or Organizations: No    Attends  Engineer, structural: Never    Marital Status: Never married    Allergies: No Known Allergies  Metabolic Disorder Labs: Lab Results  Component Value Date   HGBA1C 4.8 12/24/2021   MPG 91.06 12/24/2021   No results found for: "PROLACTIN" Lab Results  Component Value Date   CHOL 161 12/24/2021   TRIG 61 12/24/2021   HDL 61 12/24/2021   CHOLHDL 2.6 12/24/2021   VLDL 12 12/24/2021   LDLCALC 88 12/24/2021   Lab Results  Component Value Date   TSH 1.080 12/24/2021    Therapeutic Level Labs: No results found for: "LITHIUM" No results found for: "VALPROATE" No results found for: "CBMZ"  Current Medications: Current Outpatient Medications  Medication Sig Dispense Refill   hydrOXYzine (ATARAX) 25 MG tablet Take 1 tablet (25 mg total) by mouth every 8 (eight) hours as needed for itching. 12 tablet 0   QUEtiapine (SEROQUEL) 25 MG tablet Take 1 tablet (25  mg total) by mouth at bedtime. 30 tablet 2   sertraline (ZOLOFT) 50 MG tablet Take 1 tablet (50 mg total) by mouth daily. 30 tablet 2   No current facility-administered medications for this visit.     Musculoskeletal: Strength & Muscle Tone: within normal limits Gait & Station: normal Patient leans: N/A  Psychiatric Specialty Exam: Review of Systems  Psychiatric/Behavioral:  Negative for decreased concentration, dysphoric mood, hallucinations, self-injury, sleep disturbance and suicidal ideas. The patient is not nervous/anxious and is not hyperactive.     There were no vitals taken for this visit.There is no height or weight on file to calculate BMI.  General Appearance: Casual  Eye Contact:  Good  Speech:  Clear and Coherent and Normal Rate  Volume:  Normal  Mood:  Euthymic  Affect:  Appropriate  Thought Process:  Coherent and Descriptions of Associations: Intact  Orientation:  Full (Time, Place, and Person)  Thought Content: WDL   Suicidal Thoughts:  No  Homicidal Thoughts:  No  Memory:  Immediate;    Good Recent;   Good Remote;   Good  Judgement:  Good  Insight:  Good  Psychomotor Activity:  Normal  Concentration:  Concentration: Good and Attention Span: Good  Recall:  Good  Fund of Knowledge: Good  Language: Good  Akathisia:  No  Handed:  Right  AIMS (if indicated): not done  Assets:  Communication Skills Desire for Improvement Housing Social Support Transportation  ADL's:  Intact  Cognition: WNL  Sleep:  Good   Screenings: AUDIT    Advertising copywriter from 07/12/2022 in [facility]  Alcohol Use Disorder Identification Test Final Score (AUDIT) 26      GAD-7    Flowsheet Row Video Visit from 02/16/2023 in [facility] Video Visit from 12/08/2022 in [facility] Video Visit from 10/13/2022 in [facility] Counselor from 07/12/2022 in [facility] Office Visit from 04/25/2022 in [facility]  Total GAD-7 Score 9 4 4 8 10       PHQ2-9    Flowsheet Row Video Visit from 02/16/2023 in [facility] Video Visit from 12[facility]08/2022 in [facility] Video Visit from 10/13/2022 in [facility] Counselor from 07/12/2022 in [facility] Office Visit from 04/25/2022 in [facility]  PHQ-2 Total Score 2 2 0 1 2  PHQ-9 Total Score 8 6 -- 7 5      Flowsheet Row Video Visit from 02/16/2023 in [facility] Video Visit from 12/08/2022 in [facility] from 10/20/2022 in [facility])  C-SSRS RISK CATEGORY No Risk No Risk No Risk        Assessment and Plan:   Betel "Sam" Ohlmann is a 22 year old, Asian female with a past psychiatric history significant for major depressive disorder and  aggression who presented to [facility] via virtual video visit for follow-up and medication refill.  Patient reports that she has not been taking her medications consistently due to being unable to receive her medications after losing her Medicaid.  Patient states that the last took her medication regularly during her last encounter with this provider.  Patient states that she still has a few pills left that she takes for emergencies.  Patient continues to endorse some depression as  well as some anxiety.  Provider discussed sending patient's medications to an affordable pharmacy.  Patient was agreeable to recommendations.  Patient's medication to be e-prescribed to [facility].  Collaboration of Care: Collaboration of Care: Medication Management AEB provider managing patient's psychiatric medications and Psychiatrist AEB patient being followed by mental health provider  Patient/Guardian was advised Release of Information must be obtained prior to any record release in order to collaborate their care with an outside provider. Patient/Guardian was advised if they have not already done so to contact the registration department to sign all necessary forms in order for [place] to release information regarding their care.   Consent: Patient/Guardian gives verbal consent for treatment and assignment of benefits for services provided during this visit. Patient/Guardian expressed understanding and agreed to proceed.   1. Current moderate episode of major depressive disorder, unspecified whether recurrent (HCC)  - sertraline (ZOLOFT) 50 MG tablet; Take 1 tablet (50 mg total) by mouth daily.  Dispense: 30 tablet; Refill: 2  2. Aggression  - QUEtiapine (SEROQUEL) 25 MG tablet; Take 1 tablet (25 mg total) by mouth at bedtime.  Dispense: 30 tablet; Refill: 2  Patient to follow-up in 2 months Provider spent a total of 14 minutes with the  patient/reviewing patient's chart  Meta Hatchet, PA 02/19/2023, 9:21 AM

## 2023-03-26 ENCOUNTER — Other Ambulatory Visit: Payer: Self-pay

## 2023-04-26 ENCOUNTER — Other Ambulatory Visit: Payer: Self-pay

## 2023-06-16 ENCOUNTER — Other Ambulatory Visit (HOSPITAL_COMMUNITY): Payer: Self-pay | Admitting: Physician Assistant

## 2023-06-16 DIAGNOSIS — R4689 Other symptoms and signs involving appearance and behavior: Secondary | ICD-10-CM

## 2023-06-16 DIAGNOSIS — F321 Major depressive disorder, single episode, moderate: Secondary | ICD-10-CM

## 2023-06-18 ENCOUNTER — Other Ambulatory Visit: Payer: Self-pay

## 2023-08-10 ENCOUNTER — Telehealth (INDEPENDENT_AMBULATORY_CARE_PROVIDER_SITE_OTHER): Payer: No Payment, Other | Admitting: Physician Assistant

## 2023-08-10 ENCOUNTER — Other Ambulatory Visit: Payer: Self-pay

## 2023-08-10 DIAGNOSIS — F321 Major depressive disorder, single episode, moderate: Secondary | ICD-10-CM | POA: Diagnosis not present

## 2023-08-10 DIAGNOSIS — R4689 Other symptoms and signs involving appearance and behavior: Secondary | ICD-10-CM

## 2023-08-10 MED ORDER — SERTRALINE HCL 50 MG PO TABS
50.0000 mg | ORAL_TABLET | Freq: Every day | ORAL | 1 refills | Status: AC
  Filled 2023-08-10: qty 30, 30d supply, fill #0
  Filled 2023-11-05: qty 30, 30d supply, fill #1

## 2023-08-10 MED ORDER — QUETIAPINE FUMARATE 25 MG PO TABS
25.0000 mg | ORAL_TABLET | Freq: Every evening | ORAL | 1 refills | Status: AC
  Filled 2023-08-10: qty 30, 30d supply, fill #0
  Filled 2023-11-05: qty 30, 30d supply, fill #1

## 2023-08-10 NOTE — Progress Notes (Incomplete)
BH MD/PA/NP OP Progress Note  Virtual Visit via Video Note  I connected with Janet Silva on 08/10/23 at  2:30 PM EST by a video enabled telemedicine application and verified that I am speaking with the correct person using two identifiers.  Location: Patient: Home Provider: Clinic   I discussed the limitations of evaluation and management by telemedicine and the availability of in person appointments. The patient expressed understanding and agreed to proceed.  Follow Up Instructions:  I discussed the assessment and treatment plan with the patient. The patient was provided an opportunity to ask questions and all were answered. The patient agreed with the plan and demonstrated an understanding of the instructions.   The patient was advised to call back or seek an in-person evaluation if the symptoms worsen or if the condition fails to improve as anticipated.  I provided *** minutes of non-face-to-face time during this encounter.  Meta Hatchet, PA    08/10/2023 3:13 PM Janet Silva  MRN:  [id number]  Chief Complaint:  No chief complaint on file.  HPI:   Janet Silva is a 22 year old, Asian female with a past psychiatric history significant for major depressive disorder and aggression who presented to [facility] via virtual video visit for follow-up and medication refill.  Patient is currently taking the following psychiatric medications:  Seroquel 25 mg at bedtime Zoloft 50 mg daily  Patient reports that she has been doing all right; however, he states that he has not been able to receive her medications due to losing her Medicaid.  Despite not being able to take her medications regularly, patient reports that she still has been able to go to work.  Patient reports that she last took her medications consistently during her last encounter with this provider.  She reports that she had to see you pills left over from her last  prescriptions that she takes for emergency.  Patient endorses depression and rates her depression a 4 or 5 out of 10.  Patient states that she experiences depressive episodes 1 to 2 days out of the week with symptoms lasting half a day.  Patient endorses the following depressive symptoms: feelings of sadness, mood swings, and being quick to anger.  Patient endorses some anxiety as well.  A PHQ-9 screen was performed with the patient scoring an 8.  A GAD-7 screen was also performed the patient scoring a 9.  Patient is alert and oriented x 4, calm, cooperative, and fully engaged in conversation during the encounter.  Patient endorses good mood.  Patient denies suicidal or homicidal ideations.  She further denies auditory or visual hallucinations and does not appear to be responding to internal fresh external stimuli.  Patient endorses good sleep and receives on average 6 hours of sleep per night.  Patient endorses fair appetite and eats on average 1-2 meals per day.  Patient endorses alcohol consumption on occasion.  Patient endorses tobacco use and smokes on average a pack every 3 days.  Patient denies illicit drug use.  Visit Diagnosis:    ICD-10-CM   1. Current moderate episode of major depressive disorder, unspecified whether recurrent (HCC)  F32.1 sertraline (ZOLOFT) 50 MG tablet    2. Aggression  R46.89 QUEtiapine (SEROQUEL) 25 MG tablet      Past Psychiatric History:  Diagnoses: MDD, aggression, cocaine use, alcohol use Medication trials: unknown Hospitalizations: most recently [facility] 12/26/21-12/31/21 for worsening depression and SI in s/o argument with significant other Suicide attempts: yes -  via overdose August 2022  Past Medical History:  Past Medical History:  Diagnosis Date  . COVID-19   . Depression    No past surgical history on file.  Family Psychiatric History:  Mother - Alcohol abuse  Family History:  Family History  Problem Relation Age of Onset  . Alcohol abuse Mother      Social History:  Social History   Socioeconomic History  . Marital status: Single    Spouse name: Not on file  . Number of children: Not on file  . Years of education: Not on file  . Highest education level: Not on file  Occupational History  . Not on file  Tobacco Use  . Smoking status: Every Day    Current packs/day: 0.50    Types: Cigarettes  . Smokeless tobacco: Never  Vaping Use  . Vaping status: Some Days  Substance and Sexual Activity  . Alcohol use: Yes    Comment: weekly  . Drug use: Not Currently    Types: Cocaine  . Sexual activity: Yes    Birth control/protection: None  Other Topics Concern  . Not on file  Social History Narrative  . Not on file   Social Determinants of Health   Financial Resource Strain: High Risk (07/12/2022)   Overall Financial Resource Strain (CARDIA)   . Difficulty of Paying Living Expenses: Very hard  Food Insecurity: No Food Insecurity (07/12/2022)   Hunger Vital Sign   . Worried About Programme researcher, broadcasting/film/video in the Last Year: Never true   . Ran Out of Food in the Last Year: Never true  Transportation Needs: Unmet Transportation Needs (07/12/2022)   PRAPARE - Transportation   . Lack of Transportation (Medical): Yes   . Lack of Transportation (Non-Medical): Yes  Physical Activity: Inactive (07/12/2022)   Exercise Vital Sign   . Days of Exercise per Week: 0 days   . Minutes of Exercise per Session: 0 min  Stress: No Stress Concern Present (07/12/2022)   [name]   . Feeling of Stress : Only a little  Social Connections: Socially Isolated (07/12/2022)   Social Connection and Isolation Panel [NHANES]   . Frequency of Communication with Friends and Family: More than three times a week   . Frequency of Social Gatherings with Friends and Family: Once a week   . Attends Religious Services: Never   . Active Member of Clubs or Organizations: No   . Attends Tax inspector Meetings: Never   . Marital Status: Never married    Allergies: No Known Allergies  Metabolic Disorder Labs: Lab Results  Component Value Date   HGBA1C 4.8 12/24/2021   MPG 91.06 12/24/2021   No results found for: "PROLACTIN" Lab Results  Component Value Date   CHOL 161 12/24/2021   TRIG 61 12/24/2021   HDL 61 12/24/2021   CHOLHDL 2.6 12/24/2021   VLDL 12 12/24/2021   LDLCALC 88 12/24/2021   Lab Results  Component Value Date   TSH 1.080 12/24/2021    Therapeutic Level Labs: No results found for: "LITHIUM" No results found for: "VALPROATE" No results found for: "CBMZ"  Current Medications: Current Outpatient Medications  Medication Sig Dispense Refill  . hydrOXYzine (ATARAX) 25 MG tablet Take 1 tablet (25 mg total) by mouth every 8 (eight) hours as needed for itching. 12 tablet 0  . QUEtiapine (SEROQUEL) 25 MG tablet Take 1 tablet (25 mg total) by mouth at bedtime. 30 tablet  1  . sertraline (ZOLOFT) 50 MG tablet Take 1 tablet (50 mg total) by mouth daily. 30 tablet 1   No current facility-administered medications for this visit.     Musculoskeletal: Strength & Muscle Tone: within normal limits Gait & Station: normal Patient leans: N/A  Psychiatric Specialty Exam: Review of Systems  Psychiatric/Behavioral:  Positive for dysphoric mood and sleep disturbance. Negative for decreased concentration, hallucinations, self-injury and suicidal ideas. The patient is not nervous/anxious and is not hyperactive.     There were no vitals taken for this visit.There is no height or weight on file to calculate BMI.  General Appearance: Casual  Eye Contact:  Good  Speech:  Clear and Coherent and Normal Rate  Volume:  Normal  Mood:  Depressed and Dysphoric  Affect:  Congruent  Thought Process:  Coherent and Descriptions of Associations: Intact  Orientation:  Full (Time, Place, and Person)  Thought Content: WDL   Suicidal Thoughts:  No  Homicidal Thoughts:  No   Memory:  Immediate;   Good Recent;   Good Remote;   Good  Judgement:  Good  Insight:  Good  Psychomotor Activity:  Normal  Concentration:  Concentration: Good and Attention Span: Good  Recall:  Good  Fund of Knowledge: Good  Language: Good  Akathisia:  No  Handed:  Right  AIMS (if indicated): not done  Assets:  Communication Skills Desire for Improvement Housing Social Support Transportation  ADL's:  Intact  Cognition: WNL  Sleep:  Good   Screenings: AUDIT    Advertising copywriter from 07/12/2022 in [facility]  Alcohol Use Disorder Identification Test Final Score (AUDIT) 26      GAD-7    Flowsheet Row Video Visit from 08/10/2023 in [facility] Video Visit from 02/16/2023 in [facility] Video Visit from 12/08/2022 in [facility] Video Visit from 10/13/2022 in [facility] Counselor from 07/12/2022 in [facility]  Total GAD-7 Score 9 9 4 4 8       PHQ2-9    Flowsheet Row Video Visit from 08/10/2023 in [facility] Video Visit from 02/16/2023 in [facility] Video Visit from 12/08/2022 in [facility] Video Visit from 10/13/2022 in [facility] Counselor from 07/12/2022 in [facility]  PHQ-2 Total Score 2 2 2  0 1  PHQ-9 Total Score 12 8 6  -- 7      Flowsheet Row Video Visit from 08/10/2023 in [facility] Video Visit from 02/16/2023 in [facility] Video Visit from 12/08/2022 in [facility]  C-SSRS RISK CATEGORY Low Risk No Risk No Risk        Assessment and Plan:   Janet Silva is a 22 year old, Asian female with a past psychiatric history significant for major depressive  disorder and aggression who presented to [facility] via virtual video visit for follow-up and medication refill. ***  Collaboration of Care: Collaboration of Care: Medication Management AEB provider managing patient's psychiatric medications and Psychiatrist AEB patient being followed by mental health provider  Patient/Guardian was advised Release of Information must be obtained prior to any record release in order to collaborate their care with an outside provider. Patient/Guardian was advised if they have not already done so to contact the registration department to sign all necessary  forms in order for [place] to release information regarding their care.   Consent: Patient/Guardian gives verbal consent for treatment and assignment of benefits for services provided during this visit. Patient/Guardian expressed understanding and agreed to proceed.   1. Current moderate episode of major depressive disorder, unspecified whether recurrent (HCC)  - sertraline (ZOLOFT) 50 MG tablet; Take 1 tablet (50 mg total) by mouth daily.  Dispense: 30 tablet; Refill: 2  2. Aggression  - QUEtiapine (SEROQUEL) 25 MG tablet; Take 1 tablet (25 mg total) by mouth at bedtime.  Dispense: 30 tablet; Refill: 2  Patient to follow-up in 2 months Provider spent a total of 14 minutes with the patient/reviewing patient's chart  Meta Hatchet, PA 08/10/2023, 3:13 PM

## 2023-08-12 ENCOUNTER — Encounter (HOSPITAL_COMMUNITY): Payer: Self-pay | Admitting: Physician Assistant

## 2023-08-12 NOTE — Progress Notes (Unsigned)
BH MD/PA/NP OP Progress Note  Virtual Visit via Video Note  I connected with Janet Silva on 08/12/23 at  2:30 PM EST by a video enabled telemedicine application and verified that I am speaking with the correct person using two identifiers.  Location: Patient: Home Provider: Clinic   I discussed the limitations of evaluation and management by telemedicine and the availability of in person appointments. The patient expressed understanding and agreed to proceed.  Follow Up Instructions:  I discussed the assessment and treatment plan with the patient. The patient was provided an opportunity to ask questions and all were answered. The patient agreed with the plan and demonstrated an understanding of the instructions.   The patient was advised to call back or seek an in-person evaluation if the symptoms worsen or if the condition fails to improve as anticipated.  I provided 16 minutes of non-face-to-face time during this encounter.  Janet Hatchet, PA    08/12/2023 6:11 PM Janet Silva  MRN:  [id number]  Chief Complaint:  Chief Complaint  Patient presents with   Follow-up   Medication Refill   HPI:   Janet Silva is a 22 year old, Asian female with a past psychiatric history significant for major depressive disorder and aggression who presented to [facility] via virtual video visit for follow-up and medication refill.  Patient is currently taking the following psychiatric medications:  Seroquel 25 mg at bedtime Zoloft 50 mg daily  Patient presents today encounter stating that she has been off her medications for roughly a month.  Patient reports that she has been really depressed since being off her medications and rates her depression as 6 or 7 out of 10 with 10 being most severe.  Patient endorses depressive episodes mainly every day.  Patient endorses the following depressive symptoms: feelings of sadness, decreased energy,  decreased concentration, and irritability.  Patient denies crying spells, lack of motivation, feelings of guilt/worthlessness, or hopelessness.  Despite her depression, patient reports that going to work helps her to get out as bed.  Patient reports that her depression is worsened by her recent break-up and states that she got into a bad argument with her mother.  Patient denies anxiety at this time.  A PHQ-9 screen was performed with the patient scoring a 12.  A GAD-7 screen was also performed the patient scoring a 9.  Patient is alert and oriented x 4, calm, cooperative, and fully engaged in conversation during the encounter.  Patient describes her mood as all right.  Patient denies suicidal or homicidal ideations.  She further denies auditory or visual hallucinations and does not appear to be responding to internal/external stimuli.  Patient endorses fair sleep and receives on average 5 to 6 hours of sleep per night.  Patient endorses decreased appetite and eats on average 1 meal per day.  Patient denies alcohol consumption or illicit drug use.  Patient endorses tobacco use and smokes on average 3 cigarettes/day.  Visit Diagnosis:    ICD-10-CM   1. Current moderate episode of major depressive disorder, unspecified whether recurrent (HCC)  F32.1 sertraline (ZOLOFT) 50 MG tablet    2. Aggression  R46.89 QUEtiapine (SEROQUEL) 25 MG tablet      Past Psychiatric History:  Diagnoses: MDD, aggression, cocaine use, alcohol use Medication trials: unknown Hospitalizations: most recently [facility] 12/26/21-12/31/21 for worsening depression and SI in s/o argument with significant other Suicide attempts: yes - via overdose August 2022  Past Medical History:  Past Medical History:  Diagnosis Date   COVID-19    Depression    History reviewed. No pertinent surgical history.  Family Psychiatric History:  Mother - Alcohol abuse  Family History:  Family History  Problem Relation Age of Onset   Alcohol abuse  Mother     Social History:  Social History   Socioeconomic History   Marital status: Single    Spouse name: Not on file   Number of children: Not on file   Years of education: Not on file   Highest education level: Not on file  Occupational History   Not on file  Tobacco Use   Smoking status: Every Day    Current packs/day: 0.50    Types: Cigarettes   Smokeless tobacco: Never  Vaping Use   Vaping status: Some Days  Substance and Sexual Activity   Alcohol use: Yes    Comment: weekly   Drug use: Not Currently    Types: Cocaine   Sexual activity: Yes    Birth control/protection: None  Other Topics Concern   Not on file  Social History Narrative   Not on file   Social Determinants of Health   Financial Resource Strain: High Risk (07/12/2022)   Overall Financial Resource Strain (CARDIA)    Difficulty of Paying Living Expenses: Very hard  Food Insecurity: No Food Insecurity (07/12/2022)   Hunger Vital Sign    Worried About Running Out of Food in the Last Year: Never true    Ran Out of Food in the Last Year: Never true  Transportation Needs: Unmet Transportation Needs (07/12/2022)   PRAPARE - Transportation    Lack of Transportation (Medical): Yes    Lack of Transportation (Non-Medical): Yes  Physical Activity: Inactive (07/12/2022)   Exercise Vital Sign    Days of Exercise per Week: 0 days    Minutes of Exercise per Session: 0 min  Stress: No Stress Concern Present (07/12/2022)   [name]    Feeling of Stress : Only a little  Social Connections: Socially Isolated (07/12/2022)   Social Connection and Isolation Panel [NHANES]    Frequency of Communication with Friends and Family: More than three times a week    Frequency of Social Gatherings with Friends and Family: Once a week    Attends Religious Services: Never    Database administrator or Organizations: No    Attends Hospital doctor: Never    Marital Status: Never married    Allergies: No Known Allergies  Metabolic Disorder Labs: Lab Results  Component Value Date   HGBA1C 4.8 12/24/2021   MPG 91.06 12/24/2021   No results found for: "PROLACTIN" Lab Results  Component Value Date   CHOL 161 12/24/2021   TRIG 61 12/24/2021   HDL 61 12/24/2021   CHOLHDL 2.6 12/24/2021   VLDL 12 12/24/2021   LDLCALC 88 12/24/2021   Lab Results  Component Value Date   TSH 1.080 12/24/2021    Therapeutic Level Labs: No results found for: "LITHIUM" No results found for: "VALPROATE" No results found for: "CBMZ"  Current Medications: Current Outpatient Medications  Medication Sig Dispense Refill   hydrOXYzine (ATARAX) 25 MG tablet Take 1 tablet (25 mg total) by mouth every 8 (eight) hours as needed for itching. 12 tablet 0   QUEtiapine (SEROQUEL) 25 MG tablet Take 1 tablet (25 mg total) by mouth at bedtime. 30 tablet 1   sertraline (ZOLOFT) 50 MG tablet Take 1 tablet (50 mg  total) by mouth daily. 30 tablet 1   No current facility-administered medications for this visit.     Musculoskeletal: Strength & Muscle Tone: within normal limits Gait & Station: normal Patient leans: N/A  Psychiatric Specialty Exam: Review of Systems  Psychiatric/Behavioral:  Positive for dysphoric mood and sleep disturbance. Negative for decreased concentration, hallucinations, self-injury and suicidal ideas. The patient is not nervous/anxious and is not hyperactive.     There were no vitals taken for this visit.There is no height or weight on file to calculate BMI.  General Appearance: Casual  Eye Contact:  Good  Speech:  Clear and Coherent and Normal Rate  Volume:  Normal  Mood:  Depressed and Dysphoric  Affect:  Congruent  Thought Process:  Coherent and Descriptions of Associations: Intact  Orientation:  Full (Time, Place, and Person)  Thought Content: WDL   Suicidal Thoughts:  No  Homicidal Thoughts:  No  Memory:   Immediate;   Good Recent;   Good Remote;   Good  Judgement:  Good  Insight:  Good  Psychomotor Activity:  Normal  Concentration:  Concentration: Good and Attention Span: Good  Recall:  Good  Fund of Knowledge: Good  Language: Good  Akathisia:  No  Handed:  Right  AIMS (if indicated): not done  Assets:  Communication Skills Desire for Improvement Housing Social Support Transportation  ADL's:  Intact  Cognition: WNL  Sleep:  Fair   Screenings: AUDIT    Advertising copywriter from 07/12/2022 in [facility]  Alcohol Use Disorder Identification Test Final Score (AUDIT) 26      GAD-7    Flowsheet Row Video Visit from 08/10/2023 in [facility] Video Visit from 02/16/2023 in [facility] Video Visit from 12/08/2022 in [facility] Video Visit from 10/13/2022 in [facility] Counselor from 07/12/2022 in [facility]  Total GAD-7 Score 9 9 4 4 8       PHQ2-9    Flowsheet Row Video Visit from 08/10/2023 in [facility] Video Visit from 02/16/2023 in [facility] Video Visit from 12/08/2022 in [facility] Video Visit from 10/13/2022 in [facility] Counselor from 07/12/2022 in [facility]  PHQ-2 Total Score 2 2 2  0 1  PHQ-9 Total Score 12 8 6  -- 7      Flowsheet Row Video Visit from 08/10/2023 in [facility] Video Visit from 02/16/2023 in [facility] Video Visit from 12/08/2022 in [facility]  C-SSRS RISK CATEGORY Low Risk No Risk No Risk        Assessment and Plan:   Shakemia "Sam" Albarez is a 22 year old, Asian female with a past psychiatric history significant for major depressive disorder  and aggression who presented to [facility] via virtual video visit for follow-up and medication refill.  Patient presents to the encounter stating that she has been off her medications for roughly a month.  Due to being off her medications, patient reports that she has been experiencing worsening depression.  Patient denies anxiety at this time.  Patient is open to being placed back on her previously prescribed medications.  Patient's medications to be e-prescribed to pharmacy of choice.  Collaboration of Care: Collaboration of Care: Medication Management AEB provider managing patient's psychiatric medications and Psychiatrist AEB patient  being followed by mental health provider  Patient/Guardian was advised Release of Information must be obtained prior to any record release in order to collaborate their care with an outside provider. Patient/Guardian was advised if they have not already done so to contact the registration department to sign all necessary forms in order for [place] to release information regarding their care.   Consent: Patient/Guardian gives verbal consent for treatment and assignment of benefits for services provided during this visit. Patient/Guardian expressed understanding and agreed to proceed.   1. Current moderate episode of major depressive disorder, unspecified whether recurrent (HCC)  - sertraline (ZOLOFT) 50 MG tablet; Take 1 tablet (50 mg total) by mouth daily.  Dispense: 30 tablet; Refill: 1  2. Aggression  - QUEtiapine (SEROQUEL) 25 MG tablet; Take 1 tablet (25 mg total) by mouth at bedtime.  Dispense: 30 tablet; Refill: 1  Patient to follow-up in 2 months Provider spent a total of 16 minutes with the patient/reviewing patient's chart  Janet Hatchet, PA 08/12/2023, 6:11 PM

## 2023-08-20 ENCOUNTER — Other Ambulatory Visit: Payer: Self-pay

## 2023-08-20 ENCOUNTER — Other Ambulatory Visit (HOSPITAL_COMMUNITY): Payer: Self-pay

## 2023-09-27 ENCOUNTER — Telehealth (HOSPITAL_COMMUNITY): Payer: No Payment, Other | Admitting: Physician Assistant

## 2023-09-27 ENCOUNTER — Encounter (HOSPITAL_COMMUNITY): Payer: Self-pay

## 2023-11-05 ENCOUNTER — Other Ambulatory Visit (HOSPITAL_COMMUNITY): Payer: Self-pay

## 2023-11-08 ENCOUNTER — Other Ambulatory Visit: Payer: Self-pay

## 2024-03-29 ENCOUNTER — Ambulatory Visit
Admission: EM | Admit: 2024-03-29 | Discharge: 2024-03-29 | Disposition: A | Discharge status: Home / Self Care | Payer: Self-pay | Attending: Emergency Medicine | Admitting: Emergency Medicine

## 2024-03-29 ENCOUNTER — Other Ambulatory Visit: Payer: Self-pay

## 2024-03-29 DIAGNOSIS — R051 Acute cough: Secondary | ICD-10-CM

## 2024-03-29 DIAGNOSIS — B9689 Other specified bacterial agents as the cause of diseases classified elsewhere: Secondary | ICD-10-CM

## 2024-03-29 DIAGNOSIS — J019 Acute sinusitis, unspecified: Secondary | ICD-10-CM

## 2024-03-29 MED ORDER — AMOXICILLIN-POT CLAVULANATE 875-125 MG PO TABS: 1.0000 | ORAL_TABLET | Freq: Two times a day (BID) | ORAL | 0 refills | Status: AC

## 2024-03-29 NOTE — Discharge Instructions (Signed)
 Augmentin  twice daily for 7 days in a row. Take with food to avoid upset stomach. Finish all 7 days!  You should not have any leftover pills  I recommend to continue Mucinex and Tylenol  as needed.  Make sure you are drinking lots of fluids!  Please return if you feel symptoms are not improving after 3 full days on the antibiotic

## 2024-03-29 NOTE — ED Provider Notes (Signed)
 [facility]    CSN: [id number] Arrival date & time: 03/29/24  0919      History   Chief Complaint Chief Complaint  Patient presents with   Cough   Nasal Congestion    HPI Janet Silva is a 23 y.o. female.  Here with 1+ week history of sore throat, nasal congestion, productive cough. Also having chills and tactile fever, no temp measured Denies any shortness of breath or wheezing. No known sick contacts, no recent travel Using tylenol  and mucinex  Past Medical History:  Diagnosis Date   COVID-19    Depression     Patient Active Problem List   Diagnosis Date Noted   Severe major depression, single episode (HCC) 07/12/2022   Alcohol use disorder 06/30/2022   Current moderate episode of major depressive disorder (HCC) 06/30/2022   Suicide attempt by drug overdose (HCC) 05/14/2021   Cocaine use disorder, severe, in early remission (HCC) 05/14/2021    History reviewed. No pertinent surgical history.  OB History   No obstetric history on file.      Home Medications    Prior to Admission medications   Medication Sig Start Date End Date Taking? Authorizing Provider  amoxicillin -clavulanate (AUGMENTIN ) 875-125 MG tablet Take 1 tablet by mouth every 12 (twelve) hours for 7 days. 03/29/24 04/05/24 Yes Lorelai Huyser, [place], PA-C  QUEtiapine  (SEROQUEL ) 25 MG tablet Take 1 tablet (25 mg total) by mouth at bedtime. 08/10/23   Nwoko, Uchenna E, PA  sertraline  (ZOLOFT ) 50 MG tablet Take 1 tablet (50 mg total) by mouth daily. 08/10/23   Collene Reginia BRAVO, PA    Family History Family History  Problem Relation Age of Onset   Alcohol abuse Mother     Social History Social History   Tobacco Use   Smoking status: Every Day    Current packs/day: 0.50    Types: Cigarettes   Smokeless tobacco: Never  Vaping Use   Vaping status: Some Days  Substance Use Topics   Alcohol use: Yes    Comment: 1/5 of liquor per day   Drug use: Not Currently    Types: Cocaine      Allergies   Patient has no known allergies.   Review of Systems Review of Systems As per HPI  Physical Exam Triage Vital Signs ED Triage Vitals  Encounter Vitals Group     BP 03/29/24 0940 117/81     Girls Systolic BP Percentile --      Girls Diastolic BP Percentile --      Boys Systolic BP Percentile --      Boys Diastolic BP Percentile --      Pulse Rate 03/29/24 0940 100     Resp 03/29/24 0940 16     Temp 03/29/24 0940 99.5 F (37.5 C)     Temp Source 03/29/24 0940 Oral     SpO2 03/29/24 0940 97 %     Weight --      Height --      Head Circumference --      Peak Flow --      Pain Score 03/29/24 0937 7     Pain Loc --      Pain Education --      Exclude from Growth Chart --    No data found.  Updated Vital Signs BP 117/81   Pulse 100   Temp 99.5 F (37.5 C) (Oral)   Resp 16   LMP 03/13/2024   SpO2 97%   Visual Acuity  Right Eye Distance:   Left Eye Distance:   Bilateral Distance:    Right Eye Near:   Left Eye Near:    Bilateral Near:     Physical Exam Vitals and nursing note reviewed.  Constitutional:      Appearance: She is not ill-appearing.  HENT:     Right Ear: Tympanic membrane and ear canal normal.     Left Ear: Tympanic membrane and ear canal normal.     Nose: Congestion present. No rhinorrhea.     Mouth/Throat:     Mouth: Mucous membranes are moist.     Pharynx: Oropharynx is clear. No posterior oropharyngeal erythema.     Tonsils: No tonsillar exudate. 3+ on the right. 3+ on the left.     Comments: 3+ tonsils bilaterally without exudate or erythema. Baseline for patient. Normal phonation, tolerating secretions  Eyes:     Conjunctiva/sclera: Conjunctivae normal.  Cardiovascular:     Rate and Rhythm: Normal rate and regular rhythm.     Pulses: Normal pulses.     Heart sounds: Normal heart sounds.  Pulmonary:     Effort: Pulmonary effort is normal. No respiratory distress.     Breath sounds: Normal breath sounds. No wheezing,  rhonchi or rales.  Abdominal:     Palpations: Abdomen is soft.     Tenderness: There is no abdominal tenderness.  Musculoskeletal:     Cervical back: Normal range of motion.  Lymphadenopathy:     Cervical: No cervical adenopathy.  Skin:    General: Skin is warm and dry.  Neurological:     Mental Status: She is alert and oriented to person, place, and time.      UC Treatments / Results  Labs (all labs ordered are listed, but only abnormal results are displayed) Labs Reviewed - No data to display  EKG   Radiology No results found.  Procedures Procedures (including critical care time)  Medications Ordered in UC Medications - No data to display  Initial Impression / Assessment and Plan / UC Course  I have reviewed the triage vital signs and the nursing notes.  Pertinent labs & imaging results that were available during my care of the patient were reviewed by me and considered in my medical decision making (see chart for details).  Well-appearing, clear lungs throughout.  With 1+ week history of symptoms, treat for acute bacterial etiology with Augmentin  twice daily for 7 days.  Continue other supportive care.  Advise reasons to return to clinic.  Patient is agreeable to plan, no questions at this time  Final Clinical Impressions(s) / UC Diagnoses   Final diagnoses:  Acute bacterial sinusitis  Acute cough     Discharge Instructions      Augmentin  twice daily for 7 days in a row. Take with food to avoid upset stomach. Finish all 7 days!  You should not have any leftover pills  I recommend to continue Mucinex and Tylenol  as needed.  Make sure you are drinking lots of fluids!  Please return if you feel symptoms are not improving after 3 full days on the antibiotic     ED Prescriptions     Medication Sig Dispense Auth. Provider   amoxicillin -clavulanate (AUGMENTIN ) 875-125 MG tablet Take 1 tablet by mouth every 12 (twelve) hours for 7 days. 14 tablet Aileene Lanum,  [place], PA-C      PDMP not reviewed this encounter.   Vayla Wilhelmi, PA-C 03/29/24 1055

## 2024-03-29 NOTE — ED Triage Notes (Signed)
 Pt c/o swollen throat, HA, nasal drainage, chills, feels hot, Productive cough w/green [id number]
